# Patient Record
Sex: Female | Born: 1960 | Race: White | Hispanic: No | Marital: Single | Smoking: Former smoker
Health system: Southern US, Community
[De-identification: ages and names within clinical notes are randomized; demographics above are authoritative.]

## PROBLEM LIST (undated history)

## (undated) DIAGNOSIS — E079 Disorder of thyroid, unspecified: Secondary | ICD-10-CM

## (undated) DIAGNOSIS — E785 Hyperlipidemia, unspecified: Secondary | ICD-10-CM

## (undated) DIAGNOSIS — N200 Calculus of kidney: Secondary | ICD-10-CM

## (undated) DIAGNOSIS — Z87442 Personal history of urinary calculi: Secondary | ICD-10-CM

## (undated) DIAGNOSIS — I10 Essential (primary) hypertension: Secondary | ICD-10-CM

## (undated) HISTORY — PX: LITHOTRIPSY: SUR834

## (undated) HISTORY — PX: TUBAL LIGATION: SHX77

## (undated) HISTORY — PX: CHOLECYSTECTOMY: SHX55

## (undated) HISTORY — DX: Essential (primary) hypertension: I10

## (undated) HISTORY — DX: Hyperlipidemia, unspecified: E78.5

---

## 2002-04-14 ENCOUNTER — Encounter: Payer: Self-pay | Admitting: *Deleted

## 2002-04-14 ENCOUNTER — Emergency Department (HOSPITAL_COMMUNITY): Admission: EM | Admit: 2002-04-14 | Discharge: 2002-04-14 | Payer: Self-pay | Admitting: Emergency Medicine

## 2003-08-23 ENCOUNTER — Emergency Department (HOSPITAL_COMMUNITY): Admission: EM | Admit: 2003-08-23 | Discharge: 2003-08-23 | Payer: Self-pay | Admitting: Emergency Medicine

## 2003-09-06 ENCOUNTER — Ambulatory Visit (HOSPITAL_COMMUNITY): Admission: RE | Admit: 2003-09-06 | Discharge: 2003-09-06 | Payer: Self-pay | Admitting: Internal Medicine

## 2006-09-06 ENCOUNTER — Ambulatory Visit (HOSPITAL_COMMUNITY): Admission: RE | Admit: 2006-09-06 | Discharge: 2006-09-06 | Payer: Self-pay | Admitting: Family Medicine

## 2010-02-13 ENCOUNTER — Ambulatory Visit: Payer: Self-pay | Admitting: Cardiology

## 2010-02-13 ENCOUNTER — Inpatient Hospital Stay (HOSPITAL_COMMUNITY): Admission: EM | Admit: 2010-02-13 | Discharge: 2010-02-14 | Payer: Self-pay | Admitting: Emergency Medicine

## 2010-05-01 ENCOUNTER — Encounter (HOSPITAL_COMMUNITY): Admission: RE | Admit: 2010-05-01 | Discharge: 2010-05-02 | Payer: Self-pay | Admitting: Family Medicine

## 2010-10-22 LAB — HEPATIC FUNCTION PANEL
ALT: 19 U/L (ref 0–35)
Albumin: 3.1 g/dL — ABNORMAL LOW (ref 3.5–5.2)
Alkaline Phosphatase: 59 U/L (ref 39–117)
Total Protein: 5.9 g/dL — ABNORMAL LOW (ref 6.0–8.3)

## 2010-10-22 LAB — DIFFERENTIAL
Basophils Relative: 1 % (ref 0–1)
Eosinophils Absolute: 0.4 10*3/uL (ref 0.0–0.7)
Eosinophils Absolute: 0.4 10*3/uL (ref 0.0–0.7)
Eosinophils Relative: 4 % (ref 0–5)
Lymphocytes Relative: 16 % (ref 12–46)
Lymphs Abs: 1.6 10*3/uL (ref 0.7–4.0)
Monocytes Relative: 10 % (ref 3–12)
Neutro Abs: 7.2 10*3/uL (ref 1.7–7.7)
Neutrophils Relative %: 69 % (ref 43–77)
Neutrophils Relative %: 69 % (ref 43–77)

## 2010-10-22 LAB — BASIC METABOLIC PANEL
CO2: 25 mEq/L (ref 19–32)
CO2: 26 mEq/L (ref 19–32)
Calcium: 8.4 mg/dL (ref 8.4–10.5)
Chloride: 108 mEq/L (ref 96–112)
Creatinine, Ser: 0.83 mg/dL (ref 0.4–1.2)
GFR calc Af Amer: >60 mL/min (ref >60)
GFR calc Af Amer: >60 mL/min (ref >60)
GFR calc non Af Amer: >60 mL/min (ref >60)
Sodium: 138 mEq/L (ref 135–145)
Sodium: 139 mEq/L (ref 135–145)

## 2010-10-22 LAB — POCT CARDIAC MARKERS
Myoglobin, poc: 108 ng/mL (ref 12–200)
Troponin i, poc: <0.05 ng/mL (ref 0.00–0.09)
Troponin i, poc: <0.05 ng/mL (ref 0.00–0.09)

## 2010-10-22 LAB — CBC
Hemoglobin: 13.3 g/dL (ref 12.0–15.0)
MCH: 28.2 pg (ref 26.0–34.0)
MCH: 28.2 pg (ref 26.0–34.0)
MCHC: 33.4 g/dL (ref 30.0–36.0)
Platelets: 262 10*3/uL (ref 150–400)
Platelets: 265 10*3/uL (ref 150–400)
RBC: 4.7 MIL/uL (ref 3.87–5.11)
WBC: 10.4 10*3/uL (ref 4.0–10.5)

## 2010-10-22 LAB — MAGNESIUM: Magnesium: 1.8 mg/dL (ref 1.5–2.5)

## 2010-10-22 LAB — LIPID PANEL
Cholesterol: 139 mg/dL (ref 0–200)
HDL: 38 mg/dL — ABNORMAL LOW (ref >39)

## 2010-10-22 LAB — CARDIAC PANEL(CRET KIN+CKTOT+MB+TROPI)
CK, MB: 1.8 ng/mL (ref 0.3–4.0)
CK, MB: 1.9 ng/mL (ref 0.3–4.0)
Relative Index: INVALID (ref 0.0–2.5)
Total CK: 47 U/L (ref 7–177)
Troponin I: 0.02 ng/mL (ref 0.00–0.06)
Troponin I: 0.03 ng/mL (ref 0.00–0.06)

## 2010-10-22 LAB — PROTIME-INR: Prothrombin Time: 13.1 seconds (ref 11.6–15.2)

## 2010-10-22 LAB — D-DIMER, QUANTITATIVE: D-Dimer, Quant: 0.38 ug/mL-FEU (ref 0.00–0.48)

## 2010-12-22 NOTE — Consult Note (Signed)
Mackenzie Lynch, Mackenzie Lynch                             ACCOUNT NO.:  [id number]   MEDICAL RECORD NO.:  [id number]                  PATIENT TYPE:   LOCATION:                                       FACILITY:   PHYSICIAN:  R. Roetta Sessions, M.D.              DATE OF BIRTH:  06/30/1961   DATE OF CONSULTATION:  08/31/2003  DATE OF DISCHARGE:                                   CONSULTATION   GI CONSULTATION   REQUESTING PHYSICIAN:  [name], M.D.   CHIEF COMPLAINT:  Rectal bleeding and weakness.   HISTORY OF PRESENT ILLNESS:  This patient is a 50 year old Caucasian female  who reports approximately 10 days ago she had profound weakness.  This was  also accompanied, 2 days later by a boring epigastric pain. She notes that  the pain is worse on an empty stomach.  She does have occasional heartburn  and indigestion and was started on Nexium 40 mg approximately 1 week ago.  She denies any emesis. She also states that she is passing dark, red clots  of blood in her stools approximately 1-2 weeks prior to her menses.  This  has been going on for the last 6-8 months.  She denies any associated lower  abdominal cramping or pain.  She denies any dysphagia or odynophagia.  She  does have problems with constipation and can go up to 1 week without a bowel  movement.  She denies any history of known hemorrhoids.  She has been  increasing her dietary fiber to help with constipation with minimal relief.  She denies any history of peptic ulcer disease.  She currently uses Motrin  about 3 times a week; and did have history of frequent Goody's Powder use;  however, she stopped approximately 2 months ago altogether.   CURRENT MEDICATIONS:  1. Nexium 40 mg daily.  2. Motrin p.r.n.   ALLERGIES:  SULFA.   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  She does not have any residual deficits.   PAST SURGICAL HISTORY:  1. Cholecystectomy in 1983  secondary to cholelithiasis performed at [facility].  2. Tubal ligation in 1996.  3. Renal lithiasis with extraction in 1998.   FAMILY HISTORY:  No known family history of colorectal carcinoma, liver, or  chronic GI problems.  Mother is age 98 with CAD, father is age 59 with  diabetes mellitus and hypertension.  She has 1 sister with diabetes and  peptic ulcer disease.  She has 2 brothers who are healthy.   SOCIAL HISTORY:  Mackenzie Lynch is single and lives with her 59 year old son who  is in good health.  She is employed full time at a [employer].  She  currently smokes 1/2 pack per day for the last month; although she does  report a  1 pack per day history for approximately 22 years.  She reports  occasional, approximately 4x a year, alcohol use which usually consists of a  mixed drink.  She denies any drug use.   REVIEW OF SYSTEMS:  CONSTITUTIONAL:  Weight is stable.  Appetite is okay.  She denies any fever or chills.  CARDIOVASCULAR:  Denies any chest pain or  palpitations. PULMONARY:  She does report occasional shortness of breath on  exertion.  Denies any dyspnea, hemoptysis, or cough.  GI:  See HPI.  HEMATOLOGY:  Denies any blood dyscrasias or anemias.   PHYSICAL EXAMINATION:  VITAL SIGNS:  Weight 192 pounds.  Height 67 inches.  Temperature 98.3, blood pressure 130/90, pulse 68.  GENERAL:  This patient is a 50 year old Caucasian female who is well  developed and well nourished in no apparent distress.  SKIN:  Skin is pink, warm and dry without any rash or jaundice.  HEENT:  Sclerae are clear.  Nonicteric. Conjunctivae pink. Oropharynx pink  and moist without any lesions.  NECK:  Supple without any masses or thyromegaly.  HEART:  Regular rate and rhythm without murmurs, clicks, rubs, or gallops.  Normal S1-S2.  LUNGS:  Clear to auscultation bilaterally.  ABDOMEN:  Positive bowel sounds x4.  Soft, nontender, nondistended.  She  does have well healed  cholecystectomy scar to midabdomen.  No palpable mass  or hepatosplenomegaly.  No rebound tenderness.  RECTAL:  No external hemorrhoids visualized.  Good sphincter tone.  A small  very scant amount of light brown stool was attained which was Hemoccult  positive.  EXTREMITIES:  2+ pedal pulses bilaterally.  No pedal edema.   LABORATORY STUDIES:  From August 23, 2003:  Urinalysis clear, wet prep was  negative.  Metabolic panel:  Sodium 137, potassium 3.5, chloride 106, CO2  23, glucose 112, BUN 9, creatinine 0.8, calcium 8.8, total protein 7.1,  albumin 3.7, SGOT 23, SGPT 18, alkaline phosphatase at 62, total bilirubin  0.4, direct: 0.1, indirect 0.3, WBC 7.2, RBC 5.18, hemoglobin 15.1,  hematocrit 44.7, platelets 191.   ASSESSMENT:  1. This patient is a 50 year old Caucasian female with persistent epigastric     pain and new onset reflux symptoms along with Hemoccult positive stool.     This definitely warrants further workup.  Apparently she did have an H.     pylori drawn which was negative.  She may be presenting with nonulcer     dyspepsia versus peptic ulcer disease given heme-positive stool today.     This warrants further evaluation with EGD.  2. Rectal bleeding:  It is difficult to ascertain the source of her rectal     bleeding, as she describes it as dark clots. It may be related to benign     anorectal force given history of chronic constipation, diverticular in     nature, or from upper GI source.  Warrants further evaluation with     colonoscopy as well to rule out colorectal carcinoma which is very     unlikely.   RECOMMENDATIONS:  1. She is to continue Nexium 40 mg daily.  2. Will schedule colonoscopy and EGD with Dr. Jena Gauss as soon as possible.  I     have discussed the risks and benefits which include, but are not limited     to bleeding, perforation, infection, and drug reaction with Mackenzie Lynch.     She agrees with this plan.  Consent will be obtained. 3. Will  consider treating constipation pending colonoscopy.  4. Further recommendations pending colonoscopy and EGD.   We would like to thank Dr. Wende Crease for this kind referral.     ________________________________________  ___________________________________________  Nicholas Lose, N.P.                  Jonathon Bellows, M.D.   KC/MEDQ  D:  08/31/2003  T:  08/31/2003  Job:  [id number]   cc:   [name], M.D.   Jonathon Bellows, M.D.  [address]  [place]  [place] [address]  Fax: [phone]

## 2010-12-22 NOTE — Op Note (Signed)
[name], Mackenzie Lynch                           ACCOUNT NO.:  [id number]   MEDICAL RECORD NO.:  [id number]                   PATIENT TYPE:  AMB   LOCATION:  DAY                                  FACILITY:  [facility]   PHYSICIAN:  R. Roetta Sessions, M.D.              DATE OF BIRTH:  27-Jun-1961   DATE OF PROCEDURE:  09/06/2003  DATE OF DISCHARGE:                                 OPERATIVE REPORT   PROCEDURE:  Diagnostic EGD followed by colonoscopy.   INDICATIONS FOR PROCEDURE:  The patient is a 50 year old lady with  longstanding reflux symptoms, recent blood per rectum in setting of  constipation. EGD and colonoscopy are now being performed. This approach has  been discussed with the patient at length. The potential risks, benefits,  and alternatives have been reviewed, questions answered, she is agreeable.  Please see the documentation of medical record for more information.   MONITORING:  O2 saturation, blood pressure, pulse, and respirations were  monitored throughout the entirety of both procedures.   CONSCIOUS SEDATION FOR BOTH PROCEDURES:  Versed 6 mg IV, Demerol 75 mg IV in  divided doses.   INSTRUMENTS:  Olympus GI 440 gastroscope and colonoscope.   EGD FINDINGS:  Examination of the tubular esophagus revealed no mucosal  abnormalities.  The  EG junction was easily traversed.   STOMACH:  The gastric cavity was empty and insufflated well with air. A  thorough examination of the gastric mucosa including a retroflexed view of  the proximal stomach and esophagogastric junction demonstrated no  abnormalities. The pylorus was patent and easily traversed.   DUODENUM:  The bulb and second portion appeared normal.   THERAPEUTIC/DIAGNOSTIC MANEUVERS:  None.   The patient tolerated the procedure well and was prepared for colonoscopy.   Digital rectal exam revealed no abnormalities.   ENDOSCOPIC FINDINGS:  The prep was marginal to adequate.   RECTUM:  Examination of the rectal mucosa  including retroflexed view of the  anal verge revealed only internal hemorrhoids.   COLON:  The colonic mucosa was surveyed from the rectosigmoid junction  through the left transverse right colon to the area of the appendiceal  orifice, ileocecal valve and cecum. These structures were seen and  photographed for the record. From this level the scope was slowly withdrawn.  All previously mentioned mucosal surfaces were again seen. The colonic  mucosa appeared normal. The patient tolerated both procedures well and was  reacted in endoscopy.   IMPRESSION:  1. EGD normal esophagus, stomach, D1 and D2.  2. Colonoscopy findings, internal hemorrhoids otherwise normal rectum.  3. Normal colon.  4. The patient was just started on Nexium 40 mg orally daily a couple of     weeks ago. She was to continue Nexium 40 mg orally daily, antireflux     measures/literature provided.  5. I expect she bled from hemorrhoids as well in a setting of constipation.  MiraLax 17 g orally daily p.r.n. constipation.  6. 10 day course of Anusol-HC suppositories one per rectum at bedtime.     Literature on hemorrhoids provided to [name],  7. Followup appointment with [place] in six weeks.      ___________________________________________                                            Jonathon Bellows, M.D.   RMR/MEDQ  D:  09/06/2003  T:  09/06/2003  Job:  [id number]   cc:   Dr. ____________________

## 2014-05-14 ENCOUNTER — Encounter (HOSPITAL_COMMUNITY): Payer: Self-pay | Admitting: Emergency Medicine

## 2014-05-14 ENCOUNTER — Emergency Department (HOSPITAL_COMMUNITY): Payer: Self-pay

## 2014-05-14 ENCOUNTER — Emergency Department (HOSPITAL_COMMUNITY)
Admission: EM | Admit: 2014-05-14 | Discharge: 2014-05-14 | Disposition: A | Discharge status: Home / Self Care | Payer: Self-pay | Attending: Emergency Medicine | Admitting: Emergency Medicine

## 2014-05-14 DIAGNOSIS — N83202 Unspecified ovarian cyst, left side: Secondary | ICD-10-CM

## 2014-05-14 DIAGNOSIS — Z87442 Personal history of urinary calculi: Secondary | ICD-10-CM | POA: Insufficient documentation

## 2014-05-14 DIAGNOSIS — Z9889 Other specified postprocedural states: Secondary | ICD-10-CM | POA: Insufficient documentation

## 2014-05-14 DIAGNOSIS — N390 Urinary tract infection, site not specified: Secondary | ICD-10-CM | POA: Insufficient documentation

## 2014-05-14 DIAGNOSIS — Z9049 Acquired absence of other specified parts of digestive tract: Secondary | ICD-10-CM | POA: Insufficient documentation

## 2014-05-14 DIAGNOSIS — Z9851 Tubal ligation status: Secondary | ICD-10-CM | POA: Insufficient documentation

## 2014-05-14 DIAGNOSIS — M545 Low back pain, unspecified: Secondary | ICD-10-CM

## 2014-05-14 DIAGNOSIS — Z720 Tobacco use: Secondary | ICD-10-CM | POA: Insufficient documentation

## 2014-05-14 DIAGNOSIS — Z8639 Personal history of other endocrine, nutritional and metabolic disease: Secondary | ICD-10-CM | POA: Insufficient documentation

## 2014-05-14 DIAGNOSIS — N8320 Unspecified ovarian cysts: Secondary | ICD-10-CM | POA: Insufficient documentation

## 2014-05-14 DIAGNOSIS — K5900 Constipation, unspecified: Secondary | ICD-10-CM | POA: Insufficient documentation

## 2014-05-14 DIAGNOSIS — Z791 Long term (current) use of non-steroidal anti-inflammatories (NSAID): Secondary | ICD-10-CM | POA: Insufficient documentation

## 2014-05-14 DIAGNOSIS — R52 Pain, unspecified: Secondary | ICD-10-CM

## 2014-05-14 HISTORY — DX: Calculus of kidney: N20.0

## 2014-05-14 HISTORY — DX: Disorder of thyroid, unspecified: E07.9

## 2014-05-14 LAB — BASIC METABOLIC PANEL
Anion gap: 11 (ref 5–15)
BUN: 10 mg/dL (ref 6–23)
CALCIUM: 8.8 mg/dL (ref 8.4–10.5)
CO2: 26 mEq/L (ref 19–32)
CREATININE: 0.63 mg/dL (ref 0.50–1.10)
Chloride: 102 mEq/L (ref 96–112)
GFR calc Af Amer: >90 mL/min (ref >90)
GLUCOSE: 96 mg/dL (ref 70–99)
Potassium: 4.9 mEq/L (ref 3.7–5.3)
SODIUM: 139 meq/L (ref 137–147)

## 2014-05-14 LAB — CBC WITH DIFFERENTIAL/PLATELET
Basophils Absolute: 0.1 10*3/uL (ref 0.0–0.1)
Basophils Relative: 0 % (ref 0–1)
EOS ABS: 0.3 10*3/uL (ref 0.0–0.7)
EOS PCT: 2 % (ref 0–5)
HCT: 43.1 % (ref 36.0–46.0)
Hemoglobin: 14.4 g/dL (ref 12.0–15.0)
LYMPHS ABS: 1.4 10*3/uL (ref 0.7–4.0)
Lymphocytes Relative: 10 % — ABNORMAL LOW (ref 12–46)
MCH: 29.1 pg (ref 26.0–34.0)
MCHC: 33.4 g/dL (ref 30.0–36.0)
MCV: 87.1 fL (ref 78.0–100.0)
Monocytes Absolute: 1.1 10*3/uL — ABNORMAL HIGH (ref 0.1–1.0)
Monocytes Relative: 8 % (ref 3–12)
Neutro Abs: 11.2 10*3/uL — ABNORMAL HIGH (ref 1.7–7.7)
Neutrophils Relative %: 80 % — ABNORMAL HIGH (ref 43–77)
PLATELETS: 206 10*3/uL (ref 150–400)
RBC: 4.95 MIL/uL (ref 3.87–5.11)
RDW: 13.9 % (ref 11.5–15.5)
WBC: 14.1 10*3/uL — ABNORMAL HIGH (ref 4.0–10.5)

## 2014-05-14 LAB — URINE MICROSCOPIC-ADD ON

## 2014-05-14 LAB — URINALYSIS, ROUTINE W REFLEX MICROSCOPIC
Bilirubin Urine: NEGATIVE
GLUCOSE, UA: NEGATIVE mg/dL
Ketones, ur: NEGATIVE mg/dL
NITRITE: NEGATIVE
Specific Gravity, Urine: 1.02 (ref 1.005–1.030)
UROBILINOGEN UA: 0.2 mg/dL (ref 0.0–1.0)
pH: 6 (ref 5.0–8.0)

## 2014-05-14 LAB — CK: Total CK: 24 U/L (ref 7–177)

## 2014-05-14 MED ORDER — OXYCODONE-ACETAMINOPHEN 5-325 MG PO TABS
2.0000 | ORAL_TABLET | Freq: Once | ORAL | Status: AC
  Administered 2014-05-14: 2 via ORAL
  Filled 2014-05-14: qty 2

## 2014-05-14 MED ORDER — HYDROCODONE-ACETAMINOPHEN 5-325 MG PO TABS: ORAL_TABLET | ORAL | Status: DC

## 2014-05-14 MED ORDER — METHOCARBAMOL 500 MG PO TABS: 1000.0000 mg | ORAL_TABLET | Freq: Four times a day (QID) | ORAL | Status: DC | PRN

## 2014-05-14 MED ORDER — CEPHALEXIN 500 MG PO CAPS: 500.0000 mg | ORAL_CAPSULE | Freq: Four times a day (QID) | ORAL | Status: DC

## 2014-05-14 MED ORDER — NAPROXEN 250 MG PO TABS: 250.0000 mg | ORAL_TABLET | Freq: Two times a day (BID) | ORAL | Status: DC | PRN

## 2014-05-14 NOTE — Discharge Instructions (Signed)
°Emergency Department Resource Guide °1) Find a Doctor and Pay Out of Pocket °Although you won't have to find out who is covered by your insurance plan, it is a good idea to ask around and get recommendations. You will then need to call the office and see if the doctor you have chosen will accept you as a new patient and what types of options they offer for patients who are self-pay. Some doctors offer discounts or will set up payment plans for their patients who do not have insurance, but you will need to ask so you aren't surprised when you get to your appointment. ° °2) Contact Your Local Health Department °Not all health departments have doctors that can see patients for sick visits, but many do, so it is worth a call to see if yours does. If you don't know where your local health department is, you can check in your phone book. The CDC also has a tool to help you locate your state's health department, and many state websites also have listings of all of their local health departments. ° °3) Find a Walk-in Clinic °If your illness is not likely to be very severe or complicated, you may want to try a walk in clinic. These are popping up all over the country in pharmacies, drugstores, and shopping centers. They're usually staffed by nurse practitioners or physician assistants that have been trained to treat common illnesses and complaints. They're usually fairly quick and inexpensive. However, if you have serious medical issues or chronic medical problems, these are probably not your best option. ° °No Primary Care Doctor: °- Call Health Connect at  832-8000 - they can help you locate a primary care doctor that  accepts your insurance, provides certain services, etc. °- Physician Referral Service- 1-800-533-3463 ° °Chronic Pain Problems: °Organization         Address  Phone   Notes  °[facility]  (336) 297-2271 Patients need to be referred by their primary care doctor.  ° °Medication  Assistance: °Organization         Address  Phone   Notes  °[place] [facility] [address] °[place], [place] [address] (336) 641-8030 --Must be a resident of [place] °-- Must have NO insurance coverage whatsoever (no Medicaid/ Medicare, etc.) °-- The pt. MUST have a primary care doctor that directs their care regularly and follows them in the community °  °[facility]  (866) 331-1348   °[employer]  (888) 892-1162   ° °Agencies that provide inexpensive medical care: °Organization         Address  Phone   Notes  °[facility]  (336) 832-8035   °[facility]    (336) 832-7272   °[facility] [address] °[place], [place] [address] (336) 832-4777   °[facility] [address], °[place] (336) 271-4999   °[facility]    (336) 373-0678   °[facility]    (336) 272-1050   °[facility] ° [address], [place] Phone:  (336) 832-4444, Fax:  (336) 832-4440 Hours of Operation:  9 am - 6 pm, M-F.  Also accepts Medicaid/Medicare and self-pay.  °[facility] ° [address], [place] Phone: (336) 832-3150, Fax: (336) [phone]. Hours of Operation:  8:30 am - 5:30 pm, M-F.  Also accepts Medicaid and self-pay.  °[facility] [place] [address], [place] Phone: (336) 878-6027   °[facility] [address], [place], [place] (336)723-1848, Ext. 123 Mondays & Thursdays: 7-9 AM.  First 15 patients are seen on a first come, first serve basis. °  ° °Medicaid-accepting [place] Providers: ° °Organization         Address  Phone   Notes  °[facility] [address], [place] (336) 641-2100 Also accepts self-pay patients.  °[facility] [address], [place] ° (336) 856-9996   °[facility] [address], [place]  (336) 288-8857   °[facility] [address] [place] [address], [place] (336) 299-7000   °Ve[facility] B[facility] [address], [place]  ° (336) 373-1557 Only accepts [place] Access Medicaid patients after they have their name applied to their card.  ° °Self-Pay (no insurance) in [place]: ° °Organization         Address  Phone   Notes  °Sickle Cell Patients, [facility] [address], [place] (336) 832-1970   °[facility] [address], [place] (336) 832-4400   °[facility] ° [address], [place] (336) 992-4800   °[facility]/Dr. Osei-Bonsu ° [address] [place] [address], [place] or [address], [place] (336) 841-8500 Phone number for both [place] and [place] locations is the same.  °[facility] [address], [place] (336) 299-0000   °[facility] [address] [place] [address], [place] or [address] (336) 852-7530 °(336) 878-2260   °[facility] [address], [place] (336) 350-1642, phone; (336) 294-5005, fax Sees patients 1st and 3rd Saturday of every month.  Must not qualify for public or private insurance (i.e. Medicaid, Medicare, [place] Health Choice, Veterans' Benefits) • Household income should be no more than 200% of the poverty level •The clinic cannot treat you if you are pregnant or think you are pregnant • Sexually transmitted diseases are not treated at the clinic.  ° ° °Dental Care: °Organization         Address  Phone  Notes  °[place] [facility] [address], [place] (336) 641-6152 Accepts children up to age 21 who are enrolled in Medicaid or [place] Health Choice; pregnant women with a Medicaid card; and children who have applied for Medicaid or [place] Health Choice, but were declined, whose parents can pay a reduced fee at time of service.  °[place]  [facility] [place]  [address], [place] (336) 641-7733 Accepts children up to age 21 who are enrolled in Medicaid or [place] Health Choice; pregnant women with a Medicaid card; and children who have applied for Medicaid or [place] Health Choice, but were declined, whose parents can pay a reduced fee at time of service.  °[facility] ° [address], [place] (336) 641-4533 Patients are seen by appointment only. Walk-ins are not accepted. [facility] will see patients 18 years of age and older. °Monday - Tuesday (8am-5pm) °Most Wednesdays (8:30-5pm) °$30 per visit, cash only  °[facility] ° [address], [place] (336) 641-4533 Patients are seen by appointment only. Walk-ins are not accepted. [facility] will see patients 18 years of age and older. °One   Wednesday Evening (Monthly: Volunteer Based).  $30 per visit, cash only  °[facility]  (919) 537-3737 for adults; Children under age 4, call Graduate Pediatric Dentistry at ([phone]) [phone]. Children aged 4-14, please call ([phone]) [phone] to request a pediatric application. ° Dental services are provided in all areas of dental care including fillings, crowns and bridges, complete and partial dentures, implants, gum treatment, root canals, and extractions. Preventive care is also provided. Treatment is provided to both adults and children. °Patients are selected via a lottery and there is often a waiting list. °  °[facility] [address], °[place] ° (336) 763-8833 [email or web address] °  °[facility] [address], [place], [place] (336)723-1848, Ext. 123 Second and Fourth Thursday of each month, opens at 6:30 AM; Clinic ends at 9 AM.  Patients are seen on a first-come first-served basis, and a limited number are seen during each clinic.  ° °[facility] ° [address], [place], [place] (336) 723-7904    Eligibility Requirements °You must have lived in [place], [place], or [place] counties for at least the last three months. °  You cannot be eligible for state or federal sponsored healthcare insurance, including Veterans Administration, Medicaid, or Medicare. °  You generally cannot be eligible for healthcare insurance through your employer.  °  How to apply: °Eligibility screenings are held every Tuesday and Wednesday afternoon from 1:00 pm until 4:00 pm. You do not need an appointment for the interview!  °[facility] [address], [place], [place] 336-631-2330   °[facility]  336-342-8273   °[facility]  336-703-3100   °[facility]  336-570-6415   ° °Behavioral Health Resources in the Community: °Intensive Outpatient Programs °Organization         Address  Phone  Notes  °[facility] [address], [place], [place] 336-878-6098   °[facility] [address], [place], [place] 336-832-9800   °[facility] [address], [place], [place] ° 336-882-2125   °[facility] [address],  °[place], [place] 1-800-853-5163 or 336-641-4981   °Substance Abuse Resources °Organization         Address  Phone  Notes  °[facility]  336-882-2125   °[facility]  336-784-9470   °[facility]  336-285-9073   °[facility]  336-845-3988   °Residential & Outpatient Substance Abuse Program  1-800-659-3381   °Psychological Services °Organization         Address  Phone  Notes  °[facility]  336- 832-9600   °[facility]  336- 378-7881   °[facility] [address], [place] 1-800-853-5163 or 336-641-4981   ° °Mobile Crisis Teams °Organization         Address  Phone  Notes  °[facility], Mobile Crisis Care Unit  1-877-626-1772   °Assertive °[facility] ° [address]  [place], [place] 336-834-9664   °Sharon DeEsch [address] °[place] 336-554-5454   ° °Self-Help/Support Groups °Organization         Address  Phone             Notes  °[facility] - variety of support groups  336- 373-1402 Call for more information  °Narcotics Anonymous (NA), [facility] [address], °[place]  2 meetings at this location  ° °  Residential Treatment Programs Organization         Address  Phone  Notes  [facility] [address]    [place]  1-618 630 0682   [facility]  [address] [place] [address], [place], [place]   [place], [place] 620-351-7446 Admissions: 8am-3pm M-F  [facility] [address]    [place], [place] 916-945-0388   [facility] [address] [place], [place], [place]   [facility] [address],  [place], [place]   [facility] - Intensive Outpatient [address], [place], [place]   [facility] ([place]  [place], [place] 1-785-753-2873 or 618 682 2160   [facility] [address] [place], [place] Accepts Medicaid  [facility] [place] [address]  [place] 1-909-855-7828 Substance Abuse/Addiction Treatment   [facility] Organization         Address  Phone  Notes  [facility]  [phone]   Domenic Schwab, PhD [address] [place], [place]   669-232-5089 or 316-793-0683   [facility] [place], [place] 619-154-0869   [facility] [address], [place], [place] [phone] Insurance/Medicaid/sponsorship through [facility] [address] [place] [address] [place]                                    [place], [place] [phone] [facility] [address], [place] [phone]    Dr. Adele Schilder  [phone]   [facility] Dept. 1) [address] [place] 2) [place] 3)  [address], [place] ([phone]) [phone]  [phone]   [place] (512)711-1498 or [phone] (After Hours)       Take the prescriptions as directed.  Apply moist heat or ice to the area(s) of discomfort, for 15 minutes at a time, several times per day for the next few days.  Do not fall asleep on a heating or ice pack.  Take over the counter laxative (such as miralax, milk of magnesia, senokot) AND a dulcolax suppository today and repeat both tomorrow.  Begin to take over the counter stool softener (colace), as directed on packaging, for the next month. Call your regular medical doctor and your OB/GYN doctor on Monday to schedule a follow up appointment next week.  Return to the Emergency Department immediately if worsening.

## 2014-05-14 NOTE — ED Provider Notes (Signed)
CSN: [id number]     Arrival date & time 05/14/14  1235 History   First MD Initiated Contact with Patient 05/14/14 1311     Chief Complaint  Patient presents with  . Back Pain      HPI Pt was seen at 1315. Per pt, c/o gradual onset and persistence of constant bilat LE's "aching pain" for the past 1 week, and bilateral low back "pain" for the past 3 days. Describes the pain as radiating into her bilat abd areas. Pt states she has had these symptoms previously and was dx with a UTI. Denies dysuria/hematuria, no vaginal bleeding/discharge, no fevers, no rash, no CP/SOB, no abd pain, no focal motor weakness, no tingling/numbness in extremities, no ataxia, no saddle anesthesia, no incont/retention of bowel or bladder, no specific calf pain or unilateral swelling.     Past Medical History  Diagnosis Date  . Thyroid disease   . Kidney stones    Past Surgical History  Procedure Laterality Date  . Cholecystectomy    . Lithotripsy    . Tubal ligation      History  Substance Use Topics  . Smoking status: Current Every Day Smoker -- 1.00 packs/day    Types: Cigarettes  . Smokeless tobacco: Not on file  . Alcohol Use: No    Review of Systems ROS: Statement: All systems negative except as marked or noted in the HPI; Constitutional: Negative for fever and chills. ; ; Eyes: Negative for eye pain, redness and discharge. ; ; ENMT: Negative for ear pain, hoarseness, nasal congestion, sinus pressure and sore throat. ; ; Cardiovascular: Negative for chest pain, palpitations, diaphoresis, dyspnea and peripheral edema. ; ; Respiratory: Negative for cough, wheezing and stridor. ; ; Gastrointestinal: Negative for nausea, vomiting, diarrhea, abdominal pain, blood in stool, hematemesis, jaundice and rectal bleeding. . ; ; Genitourinary: Negative for dysuria, flank pain and hematuria. ; ; Musculoskeletal: +LBP, legs pain. Negative for neck pain. Negative for swelling and trauma.; ; Skin: Negative for pruritus,  rash, abrasions, blisters, bruising and skin lesion.; ; Neuro: Negative for headache, lightheadedness and neck stiffness. Negative for weakness, altered level of consciousness , altered mental status, extremity weakness, paresthesias, involuntary movement, seizure and syncope.      Allergies  Review of patient's allergies indicates no known allergies.  Home Medications   Prior to Admission medications   Medication Sig Start Date End Date Taking? Authorizing Provider  ibuprofen (ADVIL,MOTRIN) 200 MG tablet Take 800 mg by mouth every 6 (six) hours as needed for moderate pain.   Yes Historical Provider, MD  naproxen sodium (ANAPROX) 220 MG tablet Take 220 mg by mouth daily as needed (pain).   Yes Historical Provider, MD   BP 123/75  Pulse 95  Temp(Src) 98.9 F (37.2 C) (Oral)  Resp 20  Ht 5\' 8"  (1.727 m)  Wt 170 lb (77.111 kg)  BMI 25.85 kg/m2  SpO2 99%  LMP 04/25/2014 Physical Exam 1320: Physical examination:  Nursing notes reviewed; Vital signs and O2 SAT reviewed;  Constitutional: Well developed, Well nourished, Well hydrated, In no acute distress; Head:  Normocephalic, atraumatic; Eyes: EOMI, PERRL, No scleral icterus; ENMT: Mouth and pharynx normal, Mucous membranes moist; Neck: Supple, Full range of motion, No lymphadenopathy; Cardiovascular: Regular rate and rhythm, No murmur, rub, or gallop; Respiratory: Breath sounds clear & equal bilaterally, No rales, rhonchi, wheezes.  Speaking full sentences with ease, Normal respiratory effort/excursion; Chest: Nontender, Movement normal; Abdomen: Soft, Nontender, Nondistended, Normal bowel sounds; Genitourinary: No CVA tenderness; Spine:  No midline CS, TS, LS tenderness. +mild TTP bilat lumbar paraspinal muscles.;; Extremities: Pulses normal, No tenderness, No deformity, no rash. Bilat LE's muscles compartments soft. No edema, No calf edema or asymmetry.; Neuro: AA&Ox3, Major CN grossly intact.  Speech clear. No gross focal motor or sensory  deficits in extremities. Strength 5/5 equal bilat UE's and LE's, including great toe dorsiflexion.  DTR 2/4 equal bilat UE's and LE's.  No gross sensory deficits.  Neg straight leg raises bilat. Climbs on and off stretcher easily by herself. Gait steady.; Skin: Color normal, Warm, Dry.   ED Course  Procedures     EKG Interpretation None      MDM  MDM Reviewed: previous chart, nursing note and vitals Reviewed previous: labs Interpretation: labs and CT scan   Results for orders placed during the hospital encounter of 05/14/14  CK      Result Value Ref Range   Total CK 24  7 - 177 U/L  CBC WITH DIFFERENTIAL      Result Value Ref Range   WBC 14.1 (*) 4.0 - 10.5 K/uL   RBC 4.95  3.87 - 5.11 MIL/uL   Hemoglobin 14.4  12.0 - 15.0 g/dL   HCT 43.1  36.0 - 46.0 %   MCV 87.1  78.0 - 100.0 fL   MCH 29.1  26.0 - 34.0 pg   MCHC 33.4  30.0 - 36.0 g/dL   RDW 13.9  11.5 - 15.5 %   Platelets 206  150 - 400 K/uL   Neutrophils Relative % 80 (*) 43 - 77 %   Neutro Abs 11.2 (*) 1.7 - 7.7 K/uL   Lymphocytes Relative 10 (*) 12 - 46 %   Lymphs Abs 1.4  0.7 - 4.0 K/uL   Monocytes Relative 8  3 - 12 %   Monocytes Absolute 1.1 (*) 0.1 - 1.0 K/uL   Eosinophils Relative 2  0 - 5 %   Eosinophils Absolute 0.3  0.0 - 0.7 K/uL   Basophils Relative 0  0 - 1 %   Basophils Absolute 0.1  0.0 - 0.1 K/uL  BASIC METABOLIC PANEL      Result Value Ref Range   Sodium 139  137 - 147 mEq/L   Potassium 4.9  3.7 - 5.3 mEq/L   Chloride 102  96 - 112 mEq/L   CO2 26  19 - 32 mEq/L   Glucose, Bld 96  70 - 99 mg/dL   BUN 10  6 - 23 mg/dL   Creatinine, Ser 0.63  0.50 - 1.10 mg/dL   Calcium 8.8  8.4 - 10.5 mg/dL   GFR calc non Af Amer >90  >90 mL/min   GFR calc Af Amer >90  >90 mL/min   Anion gap 11  5 - 15  URINALYSIS, ROUTINE W REFLEX MICROSCOPIC      Result Value Ref Range   Color, Urine YELLOW  YELLOW   APPearance CLOUDY (*) CLEAR   Specific Gravity, Urine 1.020  1.005 - 1.030   pH 6.0  5.0 - 8.0    Glucose, UA NEGATIVE  NEGATIVE mg/dL   Hgb urine dipstick MODERATE (*) NEGATIVE   Bilirubin Urine NEGATIVE  NEGATIVE   Ketones, ur NEGATIVE  NEGATIVE mg/dL   Protein, ur TRACE (*) NEGATIVE mg/dL   Urobilinogen, UA 0.2  0.0 - 1.0 mg/dL   Nitrite NEGATIVE  NEGATIVE   Leukocytes, UA SMALL (*) NEGATIVE  URINE MICROSCOPIC-ADD ON      Result Value Ref Range  Squamous Epithelial / LPF RARE  RARE   WBC, UA 21-50  <3 WBC/hpf   RBC / HPF 7-10  <3 RBC/hpf   Bacteria, UA FEW (*) RARE   Ct Renal Stone Study 05/14/2014   CLINICAL DATA:  Leg pain for 1 week, back pain in right lower abdominal pain for 3 days  EXAM: CT ABDOMEN AND PELVIS WITHOUT CONTRAST  TECHNIQUE: Multidetector CT imaging of the abdomen and pelvis was performed following the standard protocol without IV contrast.  COMPARISON:  None.  FINDINGS: Sagittal images of the spine shows no destructive bony lesions. Atherosclerotic calcifications of distal abdominal aorta. Lung bases are unremarkable.  Unenhanced liver shows no biliary ductal dilatation. The patient is status postcholecystectomy.  Unenhanced pancreas spleen and adrenal glands are unremarkable. Unenhanced kidneys are symmetrical in size. No hydronephrosis or hydroureter. No nephrolithiasis.  No aortic aneurysm. No calcified ureteral calculi. Moderate stool noted in right colon and proximal transverse colon. Abundant stool noted within cecum. No pericecal inflammation. Normal appendix clearly visualized.  No small bowel obstruction. No ascites or free air. No adenopathy. There is a left ovarian cyst measures 3 by 2.8 cm. The urinary bladder is unremarkable. Calcified pelvic phleboliths are noted. No inguinal adenopathy. No destructive bony lesions are noted within pelvis. Mild degenerative changes bilateral SI joints. Hip joints are unremarkable on coronal images.  IMPRESSION: 1. No nephrolithiasis.  No calcified ureteral calculi. 2. No hydronephrosis or hydroureter. 3. Status  postcholecystectomy. 4. Moderate stool noted in right colon and proximal transverse colon. Abundant stool noted within cecum. No pericecal inflammation. Normal appendix. 5. There is a left ovarian cyst measures 3 x 2.8 cm.   Electronically Signed   By: Lahoma Crocker M.D.   On: 05/14/2014 15:03    1545:  +UTI, UC pending; will tx with keflex. Bilat LE's muscles compartments soft with NMS intact; doubt compartment syndrome, doubt cauda equina. Will tx symptomatically for pain at this time. Dx and testing d/w pt and family.  Questions answered.  Verb understanding, agreeable to d/c home with outpt f/u.    Francine Graven, DO 05/16/14 2223

## 2014-05-14 NOTE — ED Notes (Signed)
Pt complains of leg pain for 1 week and back pain with radiation to lower abd for 3 days. Pt denies pain with urination.

## 2014-05-17 LAB — URINE CULTURE: Colony Count: 60000

## 2014-05-18 ENCOUNTER — Telehealth (HOSPITAL_BASED_OUTPATIENT_CLINIC_OR_DEPARTMENT_OTHER): Payer: Self-pay | Admitting: Emergency Medicine

## 2014-05-18 NOTE — Telephone Encounter (Signed)
Post ED Visit - Positive Culture Follow-up  Culture report reviewed by antimicrobial stewardship pharmacist: []  Wes Crocker, Pharm.D., BCPS [x]  Heide Guile, Pharm.D., BCPS []  Alycia Rossetti, Pharm.D., BCPS []  Rodri­guez Hevia, Pharm.D., BCPS, AAHIVP []  Legrand Como, Pharm.D., BCPS, AAHIVP []  Carly Sabat, Pharm.D. []  Elenor Quinones, Pharm.D.  Positive urine culture Treated with cephalexin, organism sensitive to the same and no further patient follow-up is required at this time.  Hazle Nordmann 05/18/2014, 9:43 AM

## 2019-08-26 ENCOUNTER — Ambulatory Visit
Admission: EM | Admit: 2019-08-26 | Discharge: 2019-08-26 | Disposition: A | Discharge status: Home / Self Care | Payer: Self-pay

## 2019-08-26 ENCOUNTER — Other Ambulatory Visit: Payer: Self-pay

## 2019-08-26 DIAGNOSIS — Z20822 Contact with and (suspected) exposure to covid-19: Secondary | ICD-10-CM

## 2019-08-26 MED ORDER — FLUTICASONE PROPIONATE 50 MCG/ACT NA SUSP: 1.0000 | Freq: Every day | NASAL | 0 refills | Status: DC

## 2019-08-26 MED ORDER — PREDNISONE 10 MG PO TABS: 20.0000 mg | ORAL_TABLET | Freq: Every day | ORAL | 0 refills | Status: DC

## 2019-08-26 MED ORDER — BENZONATATE 100 MG PO CAPS: 100.0000 mg | ORAL_CAPSULE | Freq: Three times a day (TID) | ORAL | 0 refills | Status: DC

## 2019-08-26 NOTE — ED Triage Notes (Signed)
Pt presents with nasal congestion that developed Friday. Pt was prescribed azithromycin by PCP and has taken 2 days and is feeling worse and has developed cough

## 2019-08-26 NOTE — ED Provider Notes (Signed)
[facility]    CSN: [id number] Arrival date & time: 08/26/19  [id number]      History   Chief Complaint Chief Complaint  Patient presents with  . Nasal Congestion    HPI Mackenzie Lynch is a 59 y.o. female.   Pain Hartman 59 years old female presented to the urgent care for complaint of nasal congestion for the past 7 days and any symptom of cough for the past 1 day.  Reported use of azithromycin with no relief the past 2 days.  Denies sick exposure to COVID, flu or strep.  Denies recent travel.  Denies aggravating or alleviating symptoms.  Denies previous COVID infection.   Denies fever, chills, fatigue,  rhinorrhea, sore throat,  SOB, wheezing, chest pain, nausea, vomiting, changes in bowel or bladder habits.    The history is provided by the patient. No language interpreter was used.    Past Medical History:  Diagnosis Date  . Kidney stones   . Thyroid disease     There are no problems to display for this patient.   Past Surgical History:  Procedure Laterality Date  . CHOLECYSTECTOMY    . LITHOTRIPSY    . TUBAL LIGATION      OB History   No obstetric history on file.      Home Medications    Prior to Admission medications   Medication Sig Start Date End Date Taking? Authorizing Provider  azithromycin (ZITHROMAX) 1 g powder Take 1 g by mouth once.   Yes [provider]  benzonatate (TESSALON) 100 MG capsule Take 1 capsule (100 mg total) by mouth every 8 (eight) hours. 08/26/19   Navpreet Szczygiel, Darrelyn Hillock, FNP  cephALEXin (KEFLEX) 500 MG capsule Take 1 capsule (500 mg total) by mouth 4 (four) times daily. 05/14/14   Francine Graven, DO  fluticasone (FLONASE) 50 MCG/ACT nasal spray Place 1 spray into both nostrils daily for 14 days. 08/26/19 09/09/19  Emerson Monte, FNP  HYDROcodone-acetaminophen (NORCO/VICODIN) 5-325 MG per tablet 1 or 2 tabs PO q6 hours prn pain 05/14/14   Francine Graven, DO  ibuprofen (ADVIL,MOTRIN) 200 MG tablet Take 800 mg by mouth  every 6 (six) hours as needed for moderate pain.    [provider]  lisinopril (ZESTRIL) 20 MG tablet Take 20 mg by mouth daily. 06/16/19   [provider]  methocarbamol (ROBAXIN) 500 MG tablet Take 2 tablets (1,000 mg total) by mouth 4 (four) times daily as needed for muscle spasms (muscle spasm/pain). 05/14/14   Francine Graven, DO  naproxen (NAPROSYN) 250 MG tablet Take 1 tablet (250 mg total) by mouth 2 (two) times daily as needed for mild pain or moderate pain (take with food). 05/14/14   Francine Graven, DO  naproxen sodium (ANAPROX) 220 MG tablet Take 220 mg by mouth daily as needed (pain).    [provider]  predniSONE (DELTASONE) 10 MG tablet Take 2 tablets (20 mg total) by mouth daily. 08/26/19   AvegnoDarrelyn Hillock, FNP    Family History Family History  Problem Relation Age of Onset  . Heart failure Mother   . Diabetes Father   . Hypertension Father     Social History Social History   Tobacco Use  . Smoking status: Current Every Day Smoker    Packs/day: 1.00    Types: Cigarettes  Substance Use Topics  . Alcohol use: No  . Drug use: No     Allergies   Patient has no known allergies.  Review of Systems Review of Systems  Constitutional: Negative.   HENT: Positive for congestion.   Respiratory: Positive for cough.   Cardiovascular: Negative.   Gastrointestinal: Negative.   Neurological: Negative.   All other systems reviewed and are negative.    Physical Exam Triage Vital Signs ED Triage Vitals  Enc Vitals Group     BP      Pulse      Resp      Temp      Temp src      SpO2      Weight      Height      Head Circumference      Peak Flow      Pain Score      Pain Loc      Pain Edu?      Excl. in [place]?    No data found.  Updated Vital Signs BP 110/78   Pulse 82   Temp 98.3 F (36.8 C)   Resp 20   LMP 04/25/2014   SpO2 96%   Visual Acuity Right Eye Distance:   Left Eye Distance:   Bilateral Distance:     Right Eye Near:   Left Eye Near:    Bilateral Near:     Physical Exam Vitals and nursing note reviewed.  Constitutional:      General: She is not in acute distress.    Appearance: Normal appearance. She is normal weight. She is not ill-appearing or toxic-appearing.  HENT:     Head: Normocephalic.     Right Ear: Tympanic membrane, ear canal and external ear normal. There is no impacted cerumen.     Left Ear: Tympanic membrane, ear canal and external ear normal. There is no impacted cerumen.     Nose: Nose normal. No congestion.     Mouth/Throat:     Mouth: Mucous membranes are moist.     Pharynx: Oropharynx is clear. No oropharyngeal exudate or posterior oropharyngeal erythema.  Cardiovascular:     Rate and Rhythm: Normal rate and regular rhythm.     Pulses: Normal pulses.     Heart sounds: Normal heart sounds. No murmur.  Pulmonary:     Effort: Pulmonary effort is normal. No respiratory distress.     Breath sounds: Normal breath sounds. No wheezing or rhonchi.  Chest:     Chest wall: No tenderness.  Abdominal:     General: Abdomen is flat. Bowel sounds are normal. There is no distension.     Palpations: There is no mass.     Tenderness: There is no abdominal tenderness.  Skin:    Capillary Refill: Capillary refill takes less than 2 seconds.  Neurological:     General: No focal deficit present.     Mental Status: She is alert and oriented to person, place, and time.      UC Treatments / Results  Labs (all labs ordered are listed, but only abnormal results are displayed) Labs Reviewed  NOVEL CORONAVIRUS, NAA    EKG   Radiology No results found.  Procedures Procedures (including critical care time)  Medications Ordered in UC Medications - No data to display  Initial Impression / Assessment and Plan / UC Course  I have reviewed the triage vital signs and the nursing notes.  Pertinent labs & imaging results that were available during my care of the patient  were reviewed by me and considered in my medical decision making (see chart for details).   COVID-19  test was ordered Advised patient to quarantine Flonase was prescribed  for congestion Tessalon Perles for cough Prednisone for inflammation To go to ED for worsening symptoms Patient verbalized understanding of the plan of care   Final Clinical Impressions(s) / UC Diagnoses   Final diagnoses:  Suspected COVID-19 virus infection     Discharge Instructions     COVID testing ordered.  It will take between 2-7 days for test results.  Someone will contact you regarding abnormal results.    In the meantime: You should remain isolated in your home for 10 days from symptom onset AND greater than 72 hours after symptoms resolution (absence of fever without the use of fever-reducing medication and improvement in respiratory symptoms), whichever is longer Get plenty of rest and push fluids Tessalon Perles prescribed for cough Prednisone for inflammation Flonase prescribed for nasal congestion and runny nose Use medications daily for symptom relief Use OTC medications like ibuprofen or tylenol as needed fever or pain Call or go to the ED if you have any new or worsening symptoms such as fever, worsening cough, shortness of breath, chest tightness, chest pain, turning blue, changes in mental status, etc...     ED Prescriptions    Medication Sig Dispense Auth. Provider   fluticasone (FLONASE) 50 MCG/ACT nasal spray Place 1 spray into both nostrils daily for 14 days. 16 g Xana Bradt S, FNP   predniSONE (DELTASONE) 10 MG tablet Take 2 tablets (20 mg total) by mouth daily. 15 tablet Hannahgrace Lalli, Darrelyn Hillock, FNP   benzonatate (TESSALON) 100 MG capsule Take 1 capsule (100 mg total) by mouth every 8 (eight) hours. 21 capsule Lenah Messenger, Darrelyn Hillock, FNP     PDMP not reviewed this encounter.   Emerson Monte, [place] 08/26/19 [phone]

## 2019-08-26 NOTE — Discharge Instructions (Addendum)
COVID testing ordered.  It will take between 2-7 days for test results.  Someone will contact you regarding abnormal results.    In the meantime: You should remain isolated in your home for 10 days from symptom onset AND greater than 72 hours after symptoms resolution (absence of fever without the use of fever-reducing medication and improvement in respiratory symptoms), whichever is longer Get plenty of rest and push fluids Tessalon Perles prescribed for cough Prednisone for inflammation Flonase prescribed for nasal congestion and runny nose Use medications daily for symptom relief Use OTC medications like ibuprofen or tylenol as needed fever or pain Call or go to the ED if you have any new or worsening symptoms such as fever, worsening cough, shortness of breath, chest tightness, chest pain, turning blue, changes in mental status, etc..Marland Kitchen

## 2019-08-27 LAB — NOVEL CORONAVIRUS, NAA: SARS-CoV-2, NAA: NOT DETECTED

## 2019-10-25 ENCOUNTER — Other Ambulatory Visit: Payer: Self-pay

## 2019-10-25 ENCOUNTER — Ambulatory Visit
Admission: EM | Admit: 2019-10-25 | Discharge: 2019-10-25 | Disposition: A | Discharge status: Home / Self Care | Payer: Self-pay | Attending: Emergency Medicine | Admitting: Emergency Medicine

## 2019-10-25 DIAGNOSIS — R21 Rash and other nonspecific skin eruption: Secondary | ICD-10-CM

## 2019-10-25 DIAGNOSIS — R220 Localized swelling, mass and lump, head: Secondary | ICD-10-CM

## 2019-10-25 DIAGNOSIS — L03211 Cellulitis of face: Secondary | ICD-10-CM

## 2019-10-25 MED ORDER — PREDNISONE 10 MG (21) PO TBPK: ORAL_TABLET | Freq: Every day | ORAL | 0 refills | Status: DC

## 2019-10-25 MED ORDER — AMOXICILLIN-POT CLAVULANATE 875-125 MG PO TABS: 1.0000 | ORAL_TABLET | Freq: Two times a day (BID) | ORAL | 0 refills | Status: AC

## 2019-10-25 MED ORDER — DEXAMETHASONE SODIUM PHOSPHATE 10 MG/ML IJ SOLN
10.0000 mg | Freq: Once | INTRAMUSCULAR | Status: AC
  Administered 2019-10-25: 10 mg via INTRAMUSCULAR

## 2019-10-25 NOTE — ED Provider Notes (Addendum)
[place]   [id number] 10/25/19 Arrival Time: 0814  CC: Rash  SUBJECTIVE:  Mackenzie Lynch is a 59 y.o. female who presents with facial redness and swelling localized to cheeks and top of nose that began 1 day ago.  Denies changes in soaps, detergents, close contacts with similar rash, known trigger or allergy.  Denies medications change or starting a new medication recently.  Speculates she may have gotten something on her mask that caused the rash.  Describes it as red, mildly painful and itchy.  Has tried OTC benadryl and neosporin creams without relief.  Symptoms are made worse to the touch.  Denies similar symptoms in the past.   Complains of HA, clear drainage with rash.  Also mentions needed to have a tooth pulled in the near future, but denies tooth pain.  Denies fever, chills, nausea, vomiting, oral swelling, trouble breathing, trouble swallowing, SOB, chest pain, abdominal pain, changes in bowel or bladder function.    ROS: As per HPI.  All other pertinent ROS negative.     Past Medical History:  Diagnosis Date  . Kidney stones   . Thyroid disease    Past Surgical History:  Procedure Laterality Date  . CHOLECYSTECTOMY    . LITHOTRIPSY    . TUBAL LIGATION     No Known Allergies No current facility-administered medications on file prior to encounter.   Current Outpatient Medications on File Prior to Encounter  Medication Sig Dispense Refill  . fluticasone (FLONASE) 50 MCG/ACT nasal spray Place 1 spray into both nostrils daily for 14 days. 16 g 0  . HYDROcodone-acetaminophen (NORCO/VICODIN) 5-325 MG per tablet 1 or 2 tabs PO q6 hours prn pain 20 tablet 0  . ibuprofen (ADVIL,MOTRIN) 200 MG tablet Take 800 mg by mouth every 6 (six) hours as needed for moderate pain.    Marland Kitchen lisinopril (ZESTRIL) 20 MG tablet Take 20 mg by mouth daily.    . methocarbamol (ROBAXIN) 500 MG tablet Take 2 tablets (1,000 mg total) by mouth 4 (four) times daily as needed for muscle spasms  (muscle spasm/pain). 25 tablet 0  . naproxen (NAPROSYN) 250 MG tablet Take 1 tablet (250 mg total) by mouth 2 (two) times daily as needed for mild pain or moderate pain (take with food). 14 tablet 0  . naproxen sodium (ANAPROX) 220 MG tablet Take 220 mg by mouth daily as needed (pain).     Social History   Socioeconomic History  . Marital status: Single    Spouse name: Not on file  . Number of children: Not on file  . Years of education: Not on file  . Highest education level: Not on file  Occupational History  . Not on file  Tobacco Use  . Smoking status: Current Every Day Smoker    Packs/day: 1.00    Types: Cigarettes  Substance and Sexual Activity  . Alcohol use: No  . Drug use: No  . Sexual activity: Not on file  Other Topics Concern  . Not on file  Social History Narrative  . Not on file   Social Determinants of Health   Financial Resource Strain:   . Difficulty of Paying Living Expenses:   Food Insecurity:   . Worried About Charity fundraiser in the Last Year:   . Arboriculturist in the Last Year:   Transportation Needs:   . Film/video editor (Medical):   Marland Kitchen Lack of Transportation (Non-Medical):   Physical Activity:   . Days  of Exercise per Week:   . Minutes of Exercise per Session:   Stress:   . Feeling of Stress :   Social Connections:   . Frequency of Communication with Friends and Family:   . Frequency of Social Gatherings with Friends and Family:   . Attends Religious Services:   . Active Member of Clubs or Organizations:   . Attends Archivist Meetings:   Marland Kitchen Marital Status:   Intimate Partner Violence:   . Fear of Current or Ex-Partner:   . Emotionally Abused:   Marland Kitchen Physically Abused:   . Sexually Abused:    Family History  Problem Relation Age of Onset  . Heart failure Mother   . Diabetes Father   . Hypertension Father     OBJECTIVE: BP: 113/78 HR: 80  Temp: 98.0  General appearance: alert; no distress Head: NCAT; erythema  and swelling over bilateral cheeks and nose, some clear drainage appreciated over RT nasolabial fold, NTTP, no warmth to the touch, no open wounds or bleeding (see pictures below) ENT: EACs clear, TMs pearly gray; nares patent, LT turbinate swollen and erythematous; oropharynx clear, tolerating own secretions, poor to fair dentition, no obvious abscess Lungs: clear to auscultation bilaterally Heart: regular rate and rhythm.   Extremities: no edema Skin: warm and dry; (see pictures below) Psychological: alert and cooperative; normal mood and affect       ASSESSMENT & PLAN:  1. Facial rash   2. Facial swelling   3. Cellulitis of face     Meds ordered this encounter  Medications  . amoxicillin-clavulanate (AUGMENTIN) 875-125 MG tablet    Sig: Take 1 tablet by mouth every 12 (twelve) hours for 10 days.    Dispense:  20 tablet    Refill:  0    Order Specific Question:   Supervising Provider    Answer:   Raylene Everts [id number]  . predniSONE (STERAPRED UNI-PAK 21 TAB) 10 MG (21) TBPK tablet    Sig: Take by mouth daily. Take 6 tabs by mouth daily  for 2 days, then 5 tabs for 2 days, then 4 tabs for 2 days, then 3 tabs for 2 days, 2 tabs for 2 days, then 1 tab by mouth daily for 2 days    Dispense:  42 tablet    Refill:  0    Order Specific Question:   Supervising Provider    Answer:   Raylene Everts [id number]  . dexamethasone (DECADRON) injection 10 mg   Steroid shot given in office Augmentin prescribed.  Take as directed and to completion Prednsione prescribed.  Take as directed and to completion Wash with warm water and mild soap Avoid scratching or picking at rash Follow up with PCP for recheck and to ensure symptoms are improving Return or go to the ER if you have any new or worsening symptoms such as fever, chills, nausea, vomiting, redness, swelling, discharge, if symptoms do not improve with medications, etc...  Reviewed expectations re: course of current medical  issues. Questions answered. Outlined signs and symptoms indicating need for more acute intervention. Patient verbalized understanding. After Visit Summary given.   Lestine Box, PA-C 10/25/19 [place], [place], [place] 10/25/19 [phone]

## 2019-10-25 NOTE — ED Triage Notes (Signed)
Pt seen by provider prior to RN , see provider notes

## 2019-10-25 NOTE — Discharge Instructions (Signed)
Steroid shot given in office Augmentin prescribed.  Take as directed and to completion Prednsione prescribed.  Take as directed and to completion Wash with warm water and mild soap Avoid scratching or picking at rash Follow up with PCP for recheck and to ensure symptoms are improving Return or go to the ER if you have any new or worsening symptoms such as fever, chills, nausea, vomiting, redness, swelling, discharge, if symptoms do not improve with medications, etc..Marland Kitchen

## 2019-11-21 ENCOUNTER — Emergency Department (HOSPITAL_COMMUNITY): Payer: No Typology Code available for payment source

## 2019-11-21 ENCOUNTER — Emergency Department (HOSPITAL_COMMUNITY)
Admission: EM | Admit: 2019-11-21 | Discharge: 2019-11-21 | Disposition: A | Discharge status: Home / Self Care | Payer: No Typology Code available for payment source | Attending: Emergency Medicine | Admitting: Emergency Medicine

## 2019-11-21 ENCOUNTER — Encounter (HOSPITAL_COMMUNITY): Payer: Self-pay | Admitting: Emergency Medicine

## 2019-11-21 ENCOUNTER — Other Ambulatory Visit: Payer: Self-pay

## 2019-11-21 DIAGNOSIS — Z79899 Other long term (current) drug therapy: Secondary | ICD-10-CM | POA: Diagnosis not present

## 2019-11-21 DIAGNOSIS — Y92410 Unspecified street and highway as the place of occurrence of the external cause: Secondary | ICD-10-CM | POA: Diagnosis not present

## 2019-11-21 DIAGNOSIS — F17210 Nicotine dependence, cigarettes, uncomplicated: Secondary | ICD-10-CM | POA: Diagnosis not present

## 2019-11-21 DIAGNOSIS — Y93I9 Activity, other involving external motion: Secondary | ICD-10-CM | POA: Insufficient documentation

## 2019-11-21 DIAGNOSIS — Y999 Unspecified external cause status: Secondary | ICD-10-CM | POA: Diagnosis not present

## 2019-11-21 DIAGNOSIS — M791 Myalgia, unspecified site: Secondary | ICD-10-CM | POA: Insufficient documentation

## 2019-11-21 DIAGNOSIS — Z23 Encounter for immunization: Secondary | ICD-10-CM | POA: Insufficient documentation

## 2019-11-21 DIAGNOSIS — M7918 Myalgia, other site: Secondary | ICD-10-CM

## 2019-11-21 DIAGNOSIS — M25511 Pain in right shoulder: Secondary | ICD-10-CM | POA: Diagnosis present

## 2019-11-21 MED ORDER — TETANUS-DIPHTH-ACELL PERTUSSIS 5-2.5-18.5 LF-MCG/0.5 IM SUSP
0.5000 mL | Freq: Once | INTRAMUSCULAR | Status: AC
  Administered 2019-11-21: 0.5 mL via INTRAMUSCULAR
  Filled 2019-11-21: qty 0.5

## 2019-11-21 MED ORDER — NAPROXEN 500 MG PO TABS: 500.0000 mg | ORAL_TABLET | Freq: Two times a day (BID) | ORAL | 0 refills | Status: DC

## 2019-11-21 MED ORDER — METHOCARBAMOL 500 MG PO TABS: 500.0000 mg | ORAL_TABLET | Freq: Two times a day (BID) | ORAL | 0 refills | Status: DC
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
## 2020-02-04 ENCOUNTER — Other Ambulatory Visit: Payer: Self-pay

## 2020-02-04 ENCOUNTER — Encounter (INDEPENDENT_AMBULATORY_CARE_PROVIDER_SITE_OTHER): Payer: Self-pay | Admitting: Nurse Practitioner

## 2020-02-04 ENCOUNTER — Ambulatory Visit (INDEPENDENT_AMBULATORY_CARE_PROVIDER_SITE_OTHER): Payer: 59 | Admitting: Nurse Practitioner

## 2020-02-04 VITALS — BP 145/90 | HR 74 | Temp 97.5°F | Ht 68.0 in | Wt 206.6 lb

## 2020-02-04 DIAGNOSIS — I10 Essential (primary) hypertension: Secondary | ICD-10-CM

## 2020-02-04 DIAGNOSIS — K5900 Constipation, unspecified: Secondary | ICD-10-CM

## 2020-02-04 DIAGNOSIS — E785 Hyperlipidemia, unspecified: Secondary | ICD-10-CM | POA: Diagnosis not present

## 2020-02-04 DIAGNOSIS — E669 Obesity, unspecified: Secondary | ICD-10-CM

## 2020-02-04 DIAGNOSIS — Z720 Tobacco use: Secondary | ICD-10-CM

## 2020-02-04 DIAGNOSIS — R5383 Other fatigue: Secondary | ICD-10-CM

## 2020-02-04 MED ORDER — LISINOPRIL 5 MG PO TABS: 5.0000 mg | ORAL_TABLET | Freq: Every day | ORAL | 0 refills | Status: DC

## 2020-02-04 NOTE — Progress Notes (Signed)
Subjective:  Patient ID: Mackenzie Lynch, female    DOB: [birth year]  Age: 58 y.o. MRN: [id number]  CC:  Chief Complaint  Patient presents with  . Hypertension  . Hyperlipidemia  . Establish Care  . Other    Hypotension/Fatigue      HPI  This 59 year old female comes in today to establish care at this office.  She tells me she is coming here because she would just like to have a new medical provider for her primary care.  She has a past medical history significant for hypertension, hyperlipidemia, unspecified kidney disease.  She is a current tobacco smoker.  Her main complaint today is that she has been feeling episodes of fatigue, and did experience an episode of hypotension last week.  She tells me she went to her chiropractor, and blood pressure was taken at that time and she was told that her blood pressure was 86/60.  She tells me she has had a couple of episodes of lightheadedness over the last week.  After her appoint with her chiropractor she did follow-up with her primary care provider on 01/27/20.  She tells me at that visit she was told her blood pressure was normal.  She also had blood work taken at that visit.  I do not have access to the blood work results, however she does show me the results from her telephone.  I do see that cholesterol panel was checked which did show total cholesterol 206, HDL 57, triglycerides 75, and LDL 132.  He tells me she was fasting at that time.  She also had a comprehensive medical panel drawn and this was completely normal, she had a CBC collected which did show mildly elevated white blood cells  (11.5) but otherwise was normal, a normal vitamin D level, and normal TSH.  Since that episode she has had some lingering fatigue but otherwise has felt well.  She does mention that she feels that she suffers from chronic constipation.  She tells me she can sometimes go up to 2 weeks without having a bowel movement, and she always feels bloated.  She has  tried over-the-counter stool softener with mild improvement.  She admits to not drinking enough fluid during the day.  She is not on any chronic medications currently, but tells me she was on lisinopril in the past.   Past Medical History:  Diagnosis Date  . Hyperlipidemia   . Hypertension   . Kidney stones       Family History  Problem Relation Age of Onset  . Heart failure Mother   . Diabetes Father   . Hypertension Father     Social History   Social History Narrative  . Not on file   Social History   Tobacco Use  . Smoking status: Current Every Day Smoker    Packs/day: 1.00    Types: Cigarettes  . Smokeless tobacco: Never Used  Substance Use Topics  . Alcohol use: No     No outpatient medications have been marked as taking for the 02/04/20 encounter (Office Visit) with Ailene Ards, NP.    ROS:  Review of Systems  Constitutional: Positive for malaise/fatigue. Negative for chills and fever.  Respiratory: Positive for cough. Negative for shortness of breath.   Cardiovascular: Negative for chest pain and palpitations.  Neurological: Positive for dizziness.     Objective:   Today's Vitals: BP (!) 145/90 (BP Location: Left Arm, Patient Position: Sitting, Cuff Size: Normal)  Pulse 74   Temp (!) 97.5 F (36.4 C) (Temporal)   Ht 5\' 8"  (1.727 m)   Wt 206 lb 9.6 oz (93.7 kg)   LMP 04/25/2014   SpO2 97%   BMI 31.41 kg/m  Vitals with BMI 02/04/2020 11/21/2019 08/26/2019  Height 5\' 8"  5\' 8"  -  Weight 206 lbs 10 oz 210 lbs -  BMI 62.37 62.83 -  Systolic 151 761 607  Diastolic 90 79 78  Pulse 74 83 82     Physical Exam Vitals reviewed.  Constitutional:      General: She is not in acute distress.    Appearance: Normal appearance.  HENT:     Head: Normocephalic and atraumatic.  Neck:     Vascular: No carotid bruit.  Cardiovascular:     Rate and Rhythm: Normal rate and regular rhythm.     Pulses: Normal pulses.     Heart sounds: Normal heart sounds.   Pulmonary:     Effort: Pulmonary effort is normal.     Breath sounds: Normal breath sounds.  Skin:    General: Skin is warm and dry.  Neurological:     General: No focal deficit present.     Mental Status: She is alert and oriented to person, place, and time.  Psychiatric:        Mood and Affect: Mood normal.        Behavior: Behavior normal.        Judgment: Judgment normal.       EKG: Sinus rhythm, QS wave in V1, inverted T wave in V1-V3   Assessment and Plan   1. Hypertension, unspecified type   2. Hyperlipidemia, unspecified hyperlipidemia type   3. Tobacco abuse   4. Fatigue, unspecified type   5. Obesity (BMI 30-39.9)   6. Constipation, unspecified constipation type      Plan: I will restart her on her lisinopril today to control her blood pressure.  We will start her on 5 mg daily, she will return the office in about 2 weeks to have metabolic panel rechecked also consider checking A1c at that time.  If her symptoms of fatigue persist may need to refer her to cardiology as she did have a couple of inverted T waves on EKG.  I have encouraged her to try to stop smoking, may need to consider restarting Chantix in the future to help her with smoking cessation.  We also discussed diet and lifestyle and at helping her with weight loss and controlling her hyperlipidemia.   Tests ordered No orders of the defined types were placed in this encounter.     No orders of the defined types were placed in this encounter.   Patient to follow-up in 2 weeks  Ailene Ards, NP

## 2020-02-04 NOTE — Patient Instructions (Signed)
Senna-S: Take 1-2 tablets by mouth at bedtime as needed for constipation.  [facility] Dietary Recommendations for Weight Loss What to Avoid . Avoid added sugars o Often added sugar can be found in processed foods such as many condiments, dry cereals, cakes, cookies, chips, crisps, crackers, candies, sweetened drinks, etc.  o Read labels and AVOID/DECREASE use of foods with the following in their ingredient list: Sugar, fructose, high fructose corn syrup, sucrose, glucose, maltose, dextrose, molasses, cane sugar, brown sugar, any type of syrup, agave nectar, etc.   . Avoid snacking in between meals . Avoid foods made with flour o If you are going to eat food made with flour, choose those made with whole-grains; and, minimize your consumption as much as is tolerable . Avoid processed foods o These foods are generally stocked in the middle of the grocery store. Focus on shopping on the perimeter of the grocery.  . Avoid Meat  o We recommend following a plant-based diet at [facility]. Thus, we recommend avoiding meat as a general rule. Consider eating beans, legumes, eggs, and/or dairy products for regular protein sources o If you plan on eating meat limit to 4 ounces of meat at a time and choose lean options such as Fish, chicken, [place]. Avoid red meat intake such as pork and/or steak What to Include . Vegetables o GREEN LEAFY VEGETABLES: Kale, spinach, mustard greens, collard greens, cabbage, broccoli, etc. o OTHER: Asparagus, cauliflower, eggplant, carrots, peas, Brussel sprouts, tomatoes, bell peppers, zucchini, beets, cucumbers, etc. . Grains, seeds, and legumes o Beans: kidney beans, black eyed peas, garbanzo beans, black beans, pinto beans, etc. o Whole, unrefined grains: brown rice, barley, bulgur, oatmeal, etc. . Healthy fats  o Avoid highly processed fats such as vegetable oil o Examples of healthy fats: avocado, olives, virgin olive oil, dark chocolate (?72%  Cocoa), nuts (peanuts, almonds, walnuts, cashews, pecans, etc.) . None to Low Intake of Animal Sources of Protein o Meat sources: chicken, [place], salmon, tuna. Limit to 4 ounces of meat at one time. o Consider limiting dairy sources, but when choosing dairy focus on: PLAIN [place] yogurt, cottage cheese, high-protein milk . Fruit o Choose berries  When to Eat . Intermittent Fasting: o Choosing not to eat for a specific time period, but DO FOCUS ON HYDRATION when fasting o Multiple Techniques: - Time Restricted Eating: eat 3 meals in a day, each meal lasting no more than 60 minutes, no snacks between meals - 16-18 hour fast: fast for 16 to 18 hours up to 7 days a week. Often suggested to start with 2-3 nonconsecutive days per week.  . Remember the time you sleep is counted as fasting.  . Examples of eating schedule: Fast from 7:00pm-11:00am. Eat between 11:00am-7:00pm.  - 24-hour fast: fast for 24 hours up to every other day. Often suggested to start with 1 day per week . Remember the time you sleep is counted as fasting . Examples of eating schedule:  o Eating day: eat 2-3 meals on your eating day. If doing 2 meals, each meal should last no more than 90 minutes. If doing 3 meals, each meal should last no more than 60 minutes. Finish last meal by 7:00pm. o Fasting day: Fast until 7:00pm.  o IF YOU FEEL UNWELL FOR ANY REASON/IN ANY WAY WHEN FASTING, STOP FASTING BY EATING A NUTRITIOUS SNACK OR LIGHT MEAL o ALWAYS FOCUS ON HYDRATION DURING FASTS - Acceptable Hydration sources: water, broths, tea/coffee (black tea/coffee is best but using  a small amount of whole-fat dairy products in coffee/tea is acceptable).  - Poor Hydration Sources: anything with sugar or artificial sweeteners added to it  These recommendations have been developed for patients that are actively receiving medical care from either Dr. Anastasio Champion or Jeralyn Ruths, DNP, NP-C at [facility]. These recommendations are developed  for patients with specific medical conditions and are not meant to be distributed or used by others that are not actively receiving care from either provider listed above at [facility]. It is not appropriate to participate in the above eating plans without proper medical supervision.   Reference: Rexanne Mano. The obesity code. [place]; 2016.

## 2020-02-22 ENCOUNTER — Ambulatory Visit (INDEPENDENT_AMBULATORY_CARE_PROVIDER_SITE_OTHER): Payer: 59 | Admitting: Nurse Practitioner

## 2020-02-22 ENCOUNTER — Other Ambulatory Visit: Payer: Self-pay

## 2020-02-22 ENCOUNTER — Encounter (INDEPENDENT_AMBULATORY_CARE_PROVIDER_SITE_OTHER): Payer: Self-pay | Admitting: Nurse Practitioner

## 2020-02-22 VITALS — BP 115/80 | HR 84 | Temp 96.9°F | Ht 68.0 in | Wt 202.6 lb

## 2020-02-22 DIAGNOSIS — R9431 Abnormal electrocardiogram [ECG] [EKG]: Secondary | ICD-10-CM

## 2020-02-22 DIAGNOSIS — Z1211 Encounter for screening for malignant neoplasm of colon: Secondary | ICD-10-CM | POA: Diagnosis not present

## 2020-02-22 DIAGNOSIS — Z1231 Encounter for screening mammogram for malignant neoplasm of breast: Secondary | ICD-10-CM | POA: Diagnosis not present

## 2020-02-22 DIAGNOSIS — Z131 Encounter for screening for diabetes mellitus: Secondary | ICD-10-CM

## 2020-02-22 DIAGNOSIS — Z124 Encounter for screening for malignant neoplasm of cervix: Secondary | ICD-10-CM | POA: Diagnosis not present

## 2020-02-22 DIAGNOSIS — I10 Essential (primary) hypertension: Secondary | ICD-10-CM

## 2020-02-22 NOTE — Progress Notes (Signed)
Subjective:  Patient ID: Mackenzie Lynch, female    DOB: [birth year]  Age: 59 y.o. MRN: [id number]  CC:  Chief Complaint  Patient presents with  . Hypertension  . Follow-up    Lightheadedness, constipation, health maintenance      HPI  This patient comes in today for the above.  At her last office visit she was establishing care in the office.  At that time she had discussed an episode of dizziness and hypotension.  She was found to be hypertensive at her last office visit.  I did start her on lisinopril at that time she is tolerating the medication well.  She is due to have metabolic panel checked today.  She tells me generally she has not had a significant dizzy spells since her last office visit, however she did experience some lightheadedness approximately 3 days ago that lasted most of the day.  It did eventually go away on its own.  She tells me that she feels often when she goes from laying down to sitting or standing she will feel lightheaded.  EKG was collected last office visit we did show inverted T waves in leads V1 to V3.  She does smoke cigarettes and is interested in taking Chantix to help with smoking cessation.  She has taken it in the past and is tolerated it well.  She denies any chest pain, she does have family history of coronary artery disease including a sibling and her mother.  Both of which have experienced a myocardial infarction.  She also mention constipation at her last office visit.  I had recommended she try senna S as needed and she tells me that she has been experiencing much improvement in her bowel movements.  She has not been experiencing any unintentional weight loss, her symptoms have been ongoing for most of her life and are not new onset, she was not noted to be anemic when she had blood work done at her previous primary care providers.  Regarding health maintenance she is due for cervical cancer screening via Pap smear and colon cancer screening.  She is  also due for diabetes screening.   Past Medical History:  Diagnosis Date  . Hyperlipidemia   . Hypertension   . Kidney stones       Family History  Problem Relation Age of Onset  . Heart failure Mother   . Diabetes Father   . Hypertension Father     Social History   Social History Narrative  . Not on file   Social History   Tobacco Use  . Smoking status: Current Every Day Smoker    Packs/day: 1.00    Types: Cigarettes  . Smokeless tobacco: Never Used  Substance Use Topics  . Alcohol use: No     Current Meds  Medication Sig  . lisinopril (ZESTRIL) 5 MG tablet Take 1 tablet (5 mg total) by mouth daily.    ROS:  Review of Systems  Constitutional: Negative for weight loss.  Respiratory: Negative for cough and shortness of breath.   Cardiovascular: Negative for chest pain and palpitations.  Gastrointestinal: Positive for constipation. Negative for blood in stool and melena.  Neurological: Negative for dizziness.     Objective:   Today's Vitals: BP 115/80 (BP Location: Left Arm, Patient Position: Sitting, Cuff Size: Normal)   Pulse 84   Temp (!) 96.9 F (36.1 C) (Temporal)   Ht '5\' 8"'$  (1.727 m)   Wt 202 lb 9.6 oz (91.9  kg)   LMP 04/25/2014   SpO2 93%   BMI 30.81 kg/m  Vitals with BMI 02/22/2020 02/04/2020 11/21/2019  Height '5\' 8"'$  '5\' 8"'$  '5\' 8"'$   Weight 202 lbs 10 oz 206 lbs 10 oz 210 lbs  BMI 30.81 66.59 93.57  Systolic 017 793 903  Diastolic 80 90 79  Pulse 84 74 83     Physical Exam Vitals reviewed.  Constitutional:      General: She is not in acute distress.    Appearance: Normal appearance.  HENT:     Head: Normocephalic and atraumatic.  Neck:     Vascular: No carotid bruit.  Cardiovascular:     Rate and Rhythm: Normal rate and regular rhythm.     Pulses: Normal pulses.     Heart sounds: Normal heart sounds.  Pulmonary:     Effort: Pulmonary effort is normal.     Breath sounds: Normal breath sounds.  Skin:    General: Skin is warm and  dry.  Neurological:     General: No focal deficit present.     Mental Status: She is alert and oriented to person, place, and time.  Psychiatric:        Mood and Affect: Mood normal.        Behavior: Behavior normal.        Judgment: Judgment normal.    Orthostatic vital signs: Sitting 108/72; standing 0 minutes 130/80      Assessment and Plan   1. Cervical cancer screening   2. Colon cancer screening   3. Screening for diabetes mellitus   4. Encounter for screening mammogram for malignant neoplasm of breast   5. Abnormal EKG   6. Hypertension, unspecified type      Plan: 1.  She will go for referral to OB/GYN for cervical cancer screening.  2.  She would like to hold off on colonoscopy if possible, but is open to doing fecal immunoglobulin testing to screen for colon cancer.  She is aware that if it comes back abnormal recommendation would be to undergo colonoscopy, and she is willing to do this if necessary.  She does not have any family history of colon cancer.  3.  We will collect A1c for further evaluation.  4.  I will order mammogram to screen for breast cancer.  5.  Due to her symptoms of dizziness, lightheadedness, fatigue, and the fact that she is a current cigarette smoker with positive family history of coronary artery disease and she did have an abnormal EKG at last office visit I will send her to cardiology for evaluation.  If evaluation comes back benign we will consider restarting her Chantix to help her with smoking cessation.  6.  She will continue on her lisinopril and we will collect metabolic panel today for further evaluation.   Tests ordered Orders Placed This Encounter  Procedures  . MM Digital Diagnostic Bilat  . Fecal Globin By Immunochemistry  . Hemoglobin A1c  . CMP with eGFR([facility])  . Ambulatory referral to Obstetrics / Gynecology  . Ambulatory referral to Cardiology      No orders of the defined types were placed in this  encounter.   Patient to follow-up in 3 months with Dr. Anastasio Champion or sooner as needed.  Ailene Ards, NP

## 2020-02-22 NOTE — Addendum Note (Signed)
Addended by: Jeralyn Ruths E on: 02/22/2020 04:00 PM   Modules accepted: Orders

## 2020-02-23 LAB — HEMOGLOBIN A1C
Hgb A1c MFr Bld: 5.4 % of total Hgb (ref <5.7)
Mean Plasma Glucose: 108 (calc)
eAG (mmol/L): 6 (calc)

## 2020-02-23 LAB — COMPLETE METABOLIC PANEL WITH GFR
AG Ratio: 1.4 (calc) (ref 1.0–2.5)
ALT: 15 U/L (ref 6–29)
AST: 19 U/L (ref 10–35)
Albumin: 4.3 g/dL (ref 3.6–5.1)
Alkaline phosphatase (APISO): 87 U/L (ref 37–153)
BUN: 17 mg/dL (ref 7–25)
CO2: 28 mmol/L (ref 20–32)
Calcium: 10 mg/dL (ref 8.6–10.4)
Chloride: 100 mmol/L (ref 98–110)
Creat: 0.95 mg/dL (ref 0.50–1.05)
GFR, Est African American: 78 mL/min/{1.73_m2} (ref >=60)
GFR, Est Non African American: 67 mL/min/{1.73_m2} (ref >=60)
Globulin: 3 g/dL (calc) (ref 1.9–3.7)
Glucose, Bld: 102 mg/dL — ABNORMAL HIGH (ref 65–99)
Potassium: 4.3 mmol/L (ref 3.5–5.3)
Sodium: 138 mmol/L (ref 135–146)
Total Bilirubin: 0.4 mg/dL (ref 0.2–1.2)
Total Protein: 7.3 g/dL (ref 6.1–8.1)

## 2020-02-25 ENCOUNTER — Ambulatory Visit (HOSPITAL_COMMUNITY)
Admission: RE | Admit: 2020-02-25 | Discharge: 2020-02-25 | Disposition: A | Discharge status: Home / Self Care | Payer: 59 | Source: Ambulatory Visit | Attending: Nurse Practitioner | Admitting: Nurse Practitioner

## 2020-02-25 ENCOUNTER — Other Ambulatory Visit: Payer: Self-pay

## 2020-02-25 DIAGNOSIS — Z1231 Encounter for screening mammogram for malignant neoplasm of breast: Secondary | ICD-10-CM | POA: Insufficient documentation

## 2020-02-29 ENCOUNTER — Other Ambulatory Visit (HOSPITAL_COMMUNITY): Payer: Self-pay | Admitting: Nurse Practitioner

## 2020-02-29 DIAGNOSIS — R928 Other abnormal and inconclusive findings on diagnostic imaging of breast: Secondary | ICD-10-CM

## 2020-03-07 ENCOUNTER — Telehealth (INDEPENDENT_AMBULATORY_CARE_PROVIDER_SITE_OTHER): Payer: Self-pay | Admitting: Nurse Practitioner

## 2020-03-07 NOTE — Telephone Encounter (Signed)
She is stating that she is going to give the stool cards done by Thursday if she hasnt stopped bleeding by then she will call us back to get the referral to [facility]

## 2020-03-07 NOTE — Telephone Encounter (Signed)
Russell stated that she has not due to she is having blood in her stool due to hemorrhoids and she knows that would make her test positive when she knows she is having problems with hemorrhoids at this time, please advise?

## 2020-03-07 NOTE — Telephone Encounter (Signed)
Please call this patient and ask if she has completed her stool card and if so she needs to bring it back to our office so that we can send sample for testing.  We need these results in order to complete her paperwork for her work.  Thank you.

## 2020-03-07 NOTE — Telephone Encounter (Signed)
I suppose she has 2 options. She could wait until the bleeding from her hemorrhoid has subsided and then do the screening card, or we can send her to gastroenterology for discussion regarding other options for colon cancer screening. I recommend that she do colon cancer screening because it is important to have this completed on every eligible individual, but also so that I can sign off on her paperwork she needs for work. Please let me know if she would like referral to gastroenterology, and if so let me know if she has any specific office she would prefer to be seen at.

## 2020-03-08 ENCOUNTER — Other Ambulatory Visit: Payer: Self-pay

## 2020-03-08 ENCOUNTER — Ambulatory Visit (HOSPITAL_COMMUNITY)
Admission: RE | Admit: 2020-03-08 | Discharge: 2020-03-08 | Disposition: A | Discharge status: Home / Self Care | Payer: 59 | Source: Ambulatory Visit | Attending: Nurse Practitioner | Admitting: Nurse Practitioner

## 2020-03-08 DIAGNOSIS — R928 Other abnormal and inconclusive findings on diagnostic imaging of breast: Secondary | ICD-10-CM | POA: Diagnosis not present

## 2020-03-09 ENCOUNTER — Other Ambulatory Visit (INDEPENDENT_AMBULATORY_CARE_PROVIDER_SITE_OTHER): Payer: Self-pay | Admitting: Nurse Practitioner

## 2020-03-09 DIAGNOSIS — R195 Other fecal abnormalities: Secondary | ICD-10-CM

## 2020-03-09 LAB — FECAL GLOBIN BY IMMUNOCHEMISTRY: FECAL GLOBIN RESULT:: DETECTED — AB

## 2020-03-09 NOTE — Progress Notes (Signed)
Order for referral to gastroenterology for abnormal fit test

## 2020-03-09 NOTE — Progress Notes (Signed)
Noted Mackenzie Lynch is aware

## 2020-03-21 ENCOUNTER — Encounter (INDEPENDENT_AMBULATORY_CARE_PROVIDER_SITE_OTHER): Payer: Self-pay | Admitting: Gastroenterology

## 2020-03-21 ENCOUNTER — Telehealth (INDEPENDENT_AMBULATORY_CARE_PROVIDER_SITE_OTHER): Payer: Self-pay

## 2020-03-21 ENCOUNTER — Ambulatory Visit (INDEPENDENT_AMBULATORY_CARE_PROVIDER_SITE_OTHER): Payer: 59 | Admitting: Gastroenterology

## 2020-03-21 ENCOUNTER — Other Ambulatory Visit: Payer: Self-pay

## 2020-03-21 DIAGNOSIS — K5900 Constipation, unspecified: Secondary | ICD-10-CM | POA: Insufficient documentation

## 2020-03-21 DIAGNOSIS — K5909 Other constipation: Secondary | ICD-10-CM

## 2020-03-21 DIAGNOSIS — R195 Other fecal abnormalities: Secondary | ICD-10-CM | POA: Diagnosis not present

## 2020-03-21 MED ORDER — PROMETHAZINE HCL 25 MG PO TABS: 25.0000 mg | ORAL_TABLET | Freq: Four times a day (QID) | ORAL | 0 refills | Status: DC | PRN

## 2020-03-21 NOTE — Progress Notes (Signed)
Mackenzie Lynch, M.D. Gastroenterology & Hepatology [facility] [address] [place], [place] [address] Primary Care Physician: Mackenzie Ards, NP [place] [address]  Referring MD: PCP  I will communicate my assessment and recommendations to the referring MD via EMR. Note: Occasional unusual wording and randomly placed punctuation marks may result from the use of speech recognition technology to transcribe this document"  Chief Complaint: Positive FIT  History of Present Illness: Mackenzie Lynch is a 59 y.o. female with past medical history of hyperlipidemia and hypertension who presents for evaluation of positive FIT.  The patient endorses having a longstanding history of constipation, for which she recently started taking senna for the past month.  She reports that in the past she used to have a bowel movement every 1 to 2 weeks which led to significant straining and episodes of rectal bleeding intermittently for years.  States that she had rectal bleeding usually twice a month.  However, she reports that she started taking senna 2 tabs per day and this has led to softer bowel movements without straining on a daily basis.  She has not presented any more bleeding episodes since then. The patient denies having any nausea, vomiting, fever, chills, melena, hematemesis, abdominal distention, abdominal pain, diarrhea, jaundice, pruritus or weight loss.  The patient had a positive FIT testing on 03/08/2020.  Last Colonoscopy: Close to 10 years ago per the patient, was found to have hemorrhoids only.  She had an EGD same day which was unremarkable per the patient.  FHx: neg for any gastrointestinal/liver disease, no malignancies Social: smokes a pack day, neg alcohol or illicit drug use Surgical: gallbladder sx, cystoscopy with lithotripsy  Past Medical History: Past Medical History:  Diagnosis Date  . Hyperlipidemia   .  Hypertension   . Kidney stones     Past Surgical History: Past Surgical History:  Procedure Laterality Date  . CHOLECYSTECTOMY    . LITHOTRIPSY    . TUBAL LIGATION      Family History: Family History  Problem Relation Age of Onset  . Heart failure Mother   . Diabetes Father   . Hypertension Father     Social History: Social History   Tobacco Use  Smoking Status Current Every Day Smoker  . Packs/day: 1.00  . Types: Cigarettes  Smokeless Tobacco Never Used   Social History   Substance and Sexual Activity  Alcohol Use No   Social History   Substance and Sexual Activity  Drug Use No    Allergies: Allergies  Allergen Reactions  . Sulfur Nausea Only  . Sulfa Antibiotics     Medications: Current Outpatient Medications  Medication Sig Dispense Refill  . etodolac (LODINE) 400 MG tablet Take 400 mg by mouth 2 (two) times daily.    Marland Kitchen lisinopril (ZESTRIL) 5 MG tablet Take 1 tablet (5 mg total) by mouth daily. 90 tablet 0  . Sennosides-Docusate Sodium (SENNA S PO) Take by mouth at bedtime.     No current facility-administered medications for this visit.    Review of Systems: GENERAL: negative for malaise, night sweats HEENT: No changes in hearing or vision, no nose bleeds or other nasal problems. NECK: Negative for lumps, goiter, pain and significant neck swelling RESPIRATORY: Negative for cough, wheezing CARDIOVASCULAR: Negative for chest pain, leg swelling, palpitations, orthopnea GI: SEE HPI MUSCULOSKELETAL: Negative for joint pain or swelling, back pain, and muscle pain. SKIN: Negative for lesions, rash PSYCH: Negative for  sleep disturbance, mood disorder and recent psychosocial stressors. HEMATOLOGY Negative for prolonged bleeding, bruising easily, and swollen nodes. ENDOCRINE: Negative for cold or heat intolerance, polyuria, polydipsia and goiter. NEURO: negative for tremor, gait imbalance, syncope and seizures. The remainder of the review of systems is  noncontributory.  Physical Exam: BP 95/85 (BP Location: Right Arm, Patient Position: Sitting, Cuff Size: Large)   Pulse 82   Temp 99 F (37.2 C) (Oral)   Ht 5\' 8"  (1.727 m)   Wt 201 lb 8 oz (91.4 kg)   LMP 04/25/2014   BMI 30.64 kg/m  GENERAL: The patient is AO x3, in no acute distress. HEENT: Head is normocephalic and atraumatic. EOMI are intact. Mouth is well hydrated and without lesions. NECK: Supple. No masses LUNGS: Clear to auscultation. No presence of rhonchi/wheezing/rales. Adequate chest expansion HEART: RRR, normal s1 and s2. ABDOMEN: Soft, nontender, no guarding, no peritoneal signs, and nondistended. BS +. No masses. EXTREMITIES: Without any cyanosis, clubbing, rash, lesions or edema. NEUROLOGIC: AOx3, no focal motor deficit. SKIN: no jaundice, no rashes   Imaging/Labs: as above  I personally reviewed and interpreted the available labs, imaging and endoscopic files.  Impression and Plan: Mackenzie Lynch is a 59 y.o. female with past medical history of hyperlipidemia and hypertension who presents for evaluation of positive FIT. No family history of colon cancer. The patient is at average risk for colorectal cancer.    Patient had history of chronic constipation which has improved with the use of Senna, without any more episodes of rectal bleeding recently.  She would benefit from implementing dietary changes.  We will proceed with colonoscopy for evaluation of positive FIT.  We will also check CBC and iron stores as the patient had unclear history of anemia.  More than 50% of the office visit was dedicated to discussing the procedure, including the day of and risks involved. Patient understands what the procedure involves including the benefits and any risks. Patient understands alternatives to the proposed procedure. Risks including (but not limited to) bleeding, tearing of the lining (perforation), rupture of adjacent organs, problems with heart and lung function, infection,  and medication reactions. A small percentage of complications may require surgery, hospitalization, repeat endoscopic procedure, and/or transfusion. A small percentage of polyps and other tumors may not be seen.  - Schedule colonoscopy - Check CBC and iron stores - Start prune juice and eat kiwi daily  All questions were answered.      Mackenzie Peppers, MD Gastroenterology and Hepatology [facility]

## 2020-03-21 NOTE — Telephone Encounter (Signed)
Mackenzie Lynch is aware of the recommendation and agrees

## 2020-03-21 NOTE — Telephone Encounter (Signed)
If she is feeling okay and is not experiencing any lightheadedness, dizziness, shortness of breath, chest pain, headache, visual changes, etc. then I would like for her to just monitor her blood pressure at home if possible.  Thus, if she does not already have one she should pick up a blood pressure cuff from her pharmacy or drugstore.  She should check her blood pressure once a day and keep a log of this for [place] to evaluate.  If she is feeling unwell she should proceed to an urgent care or could possibly be seen here today if we have any openings.  Please let me know how she is feeling when you call her back.

## 2020-03-21 NOTE — Patient Instructions (Addendum)
Schedule colonoscopy - anti nausea medicine sent to pharmacy Perform blood workup

## 2020-03-21 NOTE — Telephone Encounter (Signed)
Mackenzie Lynch is calling stating that she had to go to the GI Dr Today and her BP was 93/61 and they advised her to contact her PCP, please advise?

## 2020-03-22 ENCOUNTER — Other Ambulatory Visit (HOSPITAL_COMMUNITY)
Admission: RE | Admit: 2020-03-22 | Discharge: 2020-03-22 | Disposition: A | Discharge status: Home / Self Care | Payer: 59 | Source: Ambulatory Visit | Attending: Adult Health | Admitting: Adult Health

## 2020-03-22 ENCOUNTER — Encounter: Payer: Self-pay | Admitting: Adult Health

## 2020-03-22 ENCOUNTER — Ambulatory Visit: Payer: 59 | Admitting: Adult Health

## 2020-03-22 VITALS — BP 94/63 | HR 78 | Ht 68.0 in | Wt 203.0 lb

## 2020-03-22 DIAGNOSIS — N812 Incomplete uterovaginal prolapse: Secondary | ICD-10-CM

## 2020-03-22 DIAGNOSIS — Z01419 Encounter for gynecological examination (general) (routine) without abnormal findings: Secondary | ICD-10-CM | POA: Diagnosis present

## 2020-03-22 LAB — CBC WITH DIFFERENTIAL/PLATELET
Absolute Monocytes: 1107 {cells}/uL — ABNORMAL HIGH (ref 200–950)
Basophils Absolute: 155 {cells}/uL (ref 0–200)
Basophils Relative: 1.3 %
Eosinophils Absolute: 631 {cells}/uL — ABNORMAL HIGH (ref 15–500)
Eosinophils Relative: 5.3 %
HCT: 44.9 % (ref 35.0–45.0)
Hemoglobin: 14.2 g/dL (ref 11.7–15.5)
Lymphs Abs: 1285 {cells}/uL (ref 850–3900)
MCH: 27.1 pg (ref 27.0–33.0)
MCHC: 31.6 g/dL — ABNORMAL LOW (ref 32.0–36.0)
MCV: 85.7 fL (ref 80.0–100.0)
MPV: 10.2 fL (ref 7.5–12.5)
Monocytes Relative: 9.3 %
Neutro Abs: 8723 {cells}/uL — ABNORMAL HIGH (ref 1500–7800)
Neutrophils Relative %: 73.3 %
Platelets: 389 Thousand/uL (ref 140–400)
RBC: 5.24 Million/uL — ABNORMAL HIGH (ref 3.80–5.10)
RDW: 12.8 % (ref 11.0–15.0)
Total Lymphocyte: 10.8 %
WBC: 11.9 Thousand/uL — ABNORMAL HIGH (ref 3.8–10.8)

## 2020-03-22 LAB — IRON,TIBC AND FERRITIN PANEL
%SAT: 17 % (ref 16–45)
Ferritin: 79 ng/mL (ref 16–232)
Iron: 57 ug/dL (ref 45–160)
TIBC: 327 ug/dL (ref 250–450)

## 2020-03-22 NOTE — Progress Notes (Signed)
Patient ID: Mackenzie Lynch, female   DOB: 15-Jun-1961, 59 y.o.   MRN: [id number] History of Present Illness: Mackenzie Lynch is a 59 year old white female,single, PM in for pelvic and pap. PCP is Mackenzie Ruths NP.   Current Medications, Allergies, Past Medical History, Past Surgical History, Family History and Social History were reviewed in [employer] record.     Review of Systems: Patient denies any headaches, hearing loss, fatigue, blurred vision, shortness of breath, chest pain, abdominal pain, problems with bowel movements(+constipation) urination, or intercourse(not having sex). No joint pain or mood swings.    Physical Exam:BP 94/63 (BP Location: Left Arm, Patient Position: Sitting, Cuff Size: Normal)   Pulse 78   Ht 5\' 8"  (1.727 m)   Wt 203 lb (92.1 kg)   LMP 04/25/2014   BMI 30.87 kg/m  General:  Well developed, well nourished, no acute distress Skin:  Warm and dry Pelvic:  External genitalia is normal in appearance, no lesions.  The vagina is pale with loss of moisture and rugae, +cystocele. Urethra has no lesions or masses. The cervix is smooth, pap with high risk HPV genotyping performed.  Uterus is felt to be normal size, shape, and contour, has uterine descensus.  No adnexal masses or tenderness noted.Bladder is non tender, no masses felt. Rectal: Good sphincter tone, no polyps, +hemorrhoids felt.  +rectocele,  Psych:  No mood changes, alert and cooperative,seems happy AA is 0 Fall risk is low PHQ 9 score is 7, no SI  Upstream - 03/22/20 1344      Pregnancy Intention Screening   Does the patient want to become pregnant in the next year? N/A    Does the patient's partner want to become pregnant in the next year? N/A    Would the patient like to discuss contraceptive options today? N/A      Contraception Wrap Up   Current Method Female Sterilization    End Method Female Sterilization    Contraception Counseling Provided No         Examination chaperoned  by Celene Squibb LPN  Impression and Plan: 1. Encounter for gynecological examination with Papanicolaou smear of cervix Pap sent Physical with PCP Pap in 3 years if normal Mammogram yearly Labs with PCP Colonoscopy in near future  2. 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

## 2020-03-23 ENCOUNTER — Telehealth (INDEPENDENT_AMBULATORY_CARE_PROVIDER_SITE_OTHER): Payer: Self-pay | Admitting: Gastroenterology

## 2020-03-23 ENCOUNTER — Encounter (INDEPENDENT_AMBULATORY_CARE_PROVIDER_SITE_OTHER): Payer: Self-pay | Admitting: *Deleted

## 2020-03-23 ENCOUNTER — Telehealth (INDEPENDENT_AMBULATORY_CARE_PROVIDER_SITE_OTHER): Payer: Self-pay | Admitting: *Deleted

## 2020-03-23 ENCOUNTER — Other Ambulatory Visit (INDEPENDENT_AMBULATORY_CARE_PROVIDER_SITE_OTHER): Payer: Self-pay | Admitting: *Deleted

## 2020-03-23 MED ORDER — SUPREP BOWEL PREP KIT 17.5-3.13-1.6 GM/177ML PO SOLN: 1.0000 | Freq: Once | ORAL | 0 refills | Status: AC

## 2020-03-23 NOTE — Telephone Encounter (Signed)
Patient needs suprep 

## 2020-03-23 NOTE — Telephone Encounter (Signed)
Called patient to discuss results of lab tests which showed norma H/H and iron stores, no need for EGD.  Patient understood and agreed.   Harvel Quale, MD Gastroenterology and Hepatology [facility]

## 2020-03-24 LAB — CYTOLOGY - PAP
Comment: NEGATIVE
Diagnosis: NEGATIVE
High risk HPV: NEGATIVE

## 2020-04-19 ENCOUNTER — Encounter (INDEPENDENT_AMBULATORY_CARE_PROVIDER_SITE_OTHER): Payer: Self-pay | Admitting: Nurse Practitioner

## 2020-04-26 ENCOUNTER — Other Ambulatory Visit: Payer: Self-pay

## 2020-04-26 ENCOUNTER — Encounter: Payer: Self-pay | Admitting: Cardiology

## 2020-04-26 ENCOUNTER — Ambulatory Visit: Payer: 59 | Admitting: Cardiology

## 2020-04-26 VITALS — BP 106/58 | HR 76 | Ht 68.0 in | Wt 204.0 lb

## 2020-04-26 DIAGNOSIS — R9431 Abnormal electrocardiogram [ECG] [EKG]: Secondary | ICD-10-CM | POA: Diagnosis not present

## 2020-04-26 DIAGNOSIS — Z23 Encounter for immunization: Secondary | ICD-10-CM | POA: Diagnosis not present

## 2020-04-26 NOTE — Progress Notes (Signed)
Clinical Summary Mackenzie Lynch is a 59 y.o.female seen today as a new consult, referred by NP Pearline Cables for the following medical problems.   1. Abnormal EKG - nonspecific ST/T changes noted by pcp on EKG - no cardiopulmonary symptoms. Denies any chest pain, no SOB or DOE - prior dizziness and fatigue that resolved with lowering her lisinopril dose.      Past Medical History:  Diagnosis Date   Hyperlipidemia    Hypertension    Kidney stones      Allergies  Allergen Reactions   Sulfur Nausea Only   Sulfa Antibiotics      Current Outpatient Medications  Medication Sig Dispense Refill   etodolac (LODINE) 400 MG tablet Take 400 mg by mouth 2 (two) times daily.     lisinopril (ZESTRIL) 5 MG tablet Take 1 tablet (5 mg total) by mouth daily. 90 tablet 0   promethazine (PHENERGAN) 25 MG tablet Take 1 tablet (25 mg total) by mouth every 6 (six) hours as needed for up to 1 dose for nausea or vomiting (take 30 minutes before starting the prep). 1 tablet 0   Sennosides-Docusate Sodium (SENNA S PO) Take by mouth at bedtime.     No current facility-administered medications for this visit.     Past Surgical History:  Procedure Laterality Date   CHOLECYSTECTOMY     LITHOTRIPSY     TUBAL LIGATION       Allergies  Allergen Reactions   Sulfur Nausea Only   Sulfa Antibiotics       Family History  Problem Relation Age of Onset   Heart failure Mother    Diabetes Father    Hypertension Father      Social History Mackenzie Lynch reports that she has been smoking cigarettes. She has been smoking about 1.00 pack per day. She has never used smokeless tobacco. Mackenzie Lynch reports no history of alcohol use.   Review of Systems CONSTITUTIONAL: No weight loss, fever, chills, weakness or fatigue.  HEENT: Eyes: No visual loss, blurred vision, double vision or yellow sclerae.No hearing loss, sneezing, congestion, runny nose or sore throat.  SKIN: No rash or itching.    CARDIOVASCULAR: per hpi RESPIRATORY: No shortness of breath, cough or sputum.  GASTROINTESTINAL: No anorexia, nausea, vomiting or diarrhea. No abdominal pain or blood.  GENITOURINARY: No burning on urination, no polyuria NEUROLOGICAL: No headache, dizziness, syncope, paralysis, ataxia, numbness or tingling in the extremities. No change in bowel or bladder control.  MUSCULOSKELETAL: No muscle, back pain, joint pain or stiffness.  LYMPHATICS: No enlarged nodes. No history of splenectomy.  PSYCHIATRIC: No history of depression or anxiety.  ENDOCRINOLOGIC: No reports of sweating, cold or heat intolerance. No polyuria or polydipsia.  Marland Kitchen   Physical Examination Today's Vitals   04/26/20 1401  BP: (!) 106/58  Pulse: 76  SpO2: 99%  Weight: 204 lb (92.5 kg)  Height: 5\' 8"  (1.727 m)   Body mass index is 31.02 kg/m.  Gen: resting comfortably, no acute distress HEENT: no scleral icterus, pupils equal round and reactive, no palptable cervical adenopathy,  CV: RRR, no m/r/g, no jvd Resp: Clear to auscultation bilaterally GI: abdomen is soft, non-tender, non-distended, normal bowel sounds, no hepatosplenomegaly MSK: extremities are warm, no edema.  Skin: warm, no rash Neuro:  no focal deficits Psych: appropriate affect     Assessment and Plan  1. Abnormal EKG - EKG reviewed from pcp and repeated today, sinus rhythm with nonspecific ST/T changes - no cardiopulmonary  symptoms - prior dizzienss and fatigue has resolved with lower lisinopril dose - no further cardiac testing is indidcated at this time.      Arnoldo Lenis, M.D.

## 2020-04-26 NOTE — Patient Instructions (Signed)
Medication Instructions:  Your physician recommends that you continue on your current medications as directed. Please refer to the Current Medication list given to you today.     Follow-Up: At [facility], you and your health needs are our priority.  As part of our continuing mission to provide you with exceptional heart care, we have created designated Provider Care Teams.  These Care Teams include your primary Cardiologist (physician) and Advanced Practice Providers (APPs -  Physician Assistants and Nurse Practitioners) who all work together to provide you with the care you need, when you need it.  We recommend signing up for the patient portal called "MyChart".  Sign up information is provided on this After Visit Summary.  MyChart is used to connect with patients for Virtual Visits (Telemedicine).  Patients are able to view lab/test results, encounter notes, upcoming appointments, etc.  Non-urgent messages can be sent to your provider as well.   To learn more about what you can do with MyChart, go to [email or web address].    Your next appointment:  As needed with Dr.Branch     Thank you for choosing [facility] !

## 2020-05-09 ENCOUNTER — Other Ambulatory Visit: Payer: Self-pay

## 2020-05-09 ENCOUNTER — Other Ambulatory Visit (HOSPITAL_COMMUNITY)
Admission: RE | Admit: 2020-05-09 | Discharge: 2020-05-09 | Disposition: A | Discharge status: Home / Self Care | Payer: 59 | Source: Ambulatory Visit | Attending: Gastroenterology | Admitting: Gastroenterology

## 2020-05-09 DIAGNOSIS — Z20822 Contact with and (suspected) exposure to covid-19: Secondary | ICD-10-CM | POA: Insufficient documentation

## 2020-05-09 DIAGNOSIS — Z01812 Encounter for preprocedural laboratory examination: Secondary | ICD-10-CM | POA: Insufficient documentation

## 2020-05-10 LAB — SARS CORONAVIRUS 2 (TAT 6-24 HRS): SARS Coronavirus 2: NEGATIVE

## 2020-05-11 ENCOUNTER — Encounter (HOSPITAL_COMMUNITY)
Admission: RE | Disposition: A | Discharge status: Home / Self Care | Payer: Self-pay | Source: Home / Self Care | Attending: Gastroenterology

## 2020-05-11 ENCOUNTER — Ambulatory Visit (HOSPITAL_COMMUNITY)
Admission: RE | Admit: 2020-05-11 | Discharge: 2020-05-11 | Disposition: A | Discharge status: Home / Self Care | Payer: 59 | Attending: Gastroenterology | Admitting: Gastroenterology

## 2020-05-11 ENCOUNTER — Ambulatory Visit (HOSPITAL_COMMUNITY): Payer: 59 | Admitting: Anesthesiology

## 2020-05-11 ENCOUNTER — Other Ambulatory Visit: Payer: Self-pay

## 2020-05-11 ENCOUNTER — Encounter (HOSPITAL_COMMUNITY): Payer: Self-pay | Admitting: Gastroenterology

## 2020-05-11 DIAGNOSIS — K625 Hemorrhage of anus and rectum: Secondary | ICD-10-CM | POA: Insufficient documentation

## 2020-05-11 DIAGNOSIS — Z9049 Acquired absence of other specified parts of digestive tract: Secondary | ICD-10-CM | POA: Diagnosis not present

## 2020-05-11 DIAGNOSIS — D123 Benign neoplasm of transverse colon: Secondary | ICD-10-CM

## 2020-05-11 DIAGNOSIS — K5909 Other constipation: Secondary | ICD-10-CM | POA: Diagnosis not present

## 2020-05-11 DIAGNOSIS — R195 Other fecal abnormalities: Secondary | ICD-10-CM | POA: Diagnosis not present

## 2020-05-11 DIAGNOSIS — K648 Other hemorrhoids: Secondary | ICD-10-CM | POA: Diagnosis not present

## 2020-05-11 DIAGNOSIS — D122 Benign neoplasm of ascending colon: Secondary | ICD-10-CM | POA: Insufficient documentation

## 2020-05-11 DIAGNOSIS — Z79899 Other long term (current) drug therapy: Secondary | ICD-10-CM | POA: Insufficient documentation

## 2020-05-11 DIAGNOSIS — I10 Essential (primary) hypertension: Secondary | ICD-10-CM | POA: Diagnosis not present

## 2020-05-11 DIAGNOSIS — Z882 Allergy status to sulfonamides status: Secondary | ICD-10-CM | POA: Insufficient documentation

## 2020-05-11 DIAGNOSIS — Z8249 Family history of ischemic heart disease and other diseases of the circulatory system: Secondary | ICD-10-CM | POA: Insufficient documentation

## 2020-05-11 DIAGNOSIS — F17210 Nicotine dependence, cigarettes, uncomplicated: Secondary | ICD-10-CM | POA: Diagnosis not present

## 2020-05-11 HISTORY — PX: POLYPECTOMY: SHX5525

## 2020-05-11 HISTORY — DX: Personal history of urinary calculi: Z87.442

## 2020-05-11 HISTORY — PX: SUBMUCOSAL TATTOO INJECTION: SHX6856

## 2020-05-11 HISTORY — PX: BIOPSY: SHX5522

## 2020-05-11 HISTORY — PX: COLONOSCOPY WITH PROPOFOL: SHX5780

## 2020-05-11 LAB — HM COLONOSCOPY

## 2020-05-11 SURGERY — COLONOSCOPY WITH PROPOFOL
Anesthesia: General

## 2020-05-11 MED ORDER — SPOT INK MARKER SYRINGE KIT
PACK | SUBMUCOSAL | Status: AC
  Filled 2020-05-11: qty 5

## 2020-05-11 MED ORDER — SPOT INK MARKER SYRINGE KIT
PACK | SUBMUCOSAL | Status: DC | PRN
  Administered 2020-05-11: 3 mL via SUBMUCOSAL

## 2020-05-11 MED ORDER — PROPOFOL 10 MG/ML IV BOLUS
INTRAVENOUS | Status: AC
  Filled 2020-05-11: qty 60

## 2020-05-11 MED ORDER — EPHEDRINE 5 MG/ML INJ
INTRAVENOUS | Status: AC
  Filled 2020-05-11: qty 10

## 2020-05-11 MED ORDER — LACTATED RINGERS IV SOLN
INTRAVENOUS | Status: DC
  Administered 2020-05-11: 1000 mL via INTRAVENOUS

## 2020-05-11 MED ORDER — SODIUM CHLORIDE FLUSH 0.9 % IV SOLN
INTRAVENOUS | Status: AC
  Filled 2020-05-11: qty 10

## 2020-05-11 MED ORDER — PROPOFOL 500 MG/50ML IV EMUL
INTRAVENOUS | Status: DC | PRN
  Administered 2020-05-11: 150 ug/kg/min via INTRAVENOUS

## 2020-05-11 MED ORDER — EPHEDRINE SULFATE 50 MG/ML IJ SOLN
INTRAMUSCULAR | Status: DC | PRN
  Administered 2020-05-11: 10 mg via INTRAVENOUS

## 2020-05-11 MED ORDER — PROPOFOL 10 MG/ML IV BOLUS
INTRAVENOUS | Status: AC
  Filled 2020-05-11: qty 120

## 2020-05-11 MED ORDER — PROPOFOL 10 MG/ML IV BOLUS
INTRAVENOUS | Status: DC | PRN
  Administered 2020-05-11 (×4): 20 mg via INTRAVENOUS
  Administered 2020-05-11: 40 mg via INTRAVENOUS
  Administered 2020-05-11: 20 mg via INTRAVENOUS
  Administered 2020-05-11: 40 mg via INTRAVENOUS
  Administered 2020-05-11 (×5): 20 mg via INTRAVENOUS

## 2020-05-11 NOTE — Transfer of Care (Signed)
Immediate Anesthesia Transfer of Care Note  Patient: Mackenzie Lynch  Procedure(s) Performed: COLONOSCOPY WITH PROPOFOL (N/A ) BIOPSY POLYPECTOMY SUBMUCOSAL TATTOO INJECTION  Patient Location: PACU  Anesthesia Type:General  Level of Consciousness: awake  Airway & Oxygen Therapy: Patient Spontanous Breathing  Post-op Assessment: Report given to RN  Post vital signs: Reviewed and stable  Last Vitals:  Vitals Value Taken Time  BP    Temp    Pulse    Resp    SpO2      Last Pain:  Vitals:   05/11/20 0743  PainSc: 0-No pain         Complications: No complications documented.

## 2020-05-11 NOTE — Discharge Instructions (Signed)
You are being discharged to home.  Resume your previous diet.  We are waiting for your pathology results.  Will refer to colorectal surgeon for resection. Your physician has recommended a repeat colonoscopy (date to be determined after pending pathology results are reviewed and evaluation by colorectal surgeon ) for surveillance.        Colonoscopy, Adult, Care After This sheet gives you information about how to care for yourself after your procedure. Your doctor may also give you more specific instructions. If you have problems or questions, call your doctor. What can I expect after the procedure? After the procedure, it is common to have:  A small amount of blood in your poop (stool) for 24 hours.  Some gas.  Mild cramping or bloating in your belly (abdomen). Follow these instructions at home: Eating and drinking   Drink enough fluid to keep your pee (urine) pale yellow.  Follow instructions from your doctor about what you cannot eat or drink.  Return to your normal diet as told by your doctor. Avoid heavy or fried foods that are hard to digest. Activity  Rest as told by your doctor.  Do not sit for a long time without moving. Get up to take short walks every 1-2 hours. This is important. Ask for help if you feel weak or unsteady.  Return to your normal activities as told by your doctor. Ask your doctor what activities are safe for you. To help cramping and bloating:   Try walking around.  Put heat on your belly as told by your doctor. Use the heat source that your doctor recommends, such as a moist heat pack or a heating pad. ? Put a towel between your skin and the heat source. ? Leave the heat on for 20-30 minutes. ? Remove the heat if your skin turns bright red. This is very important if you are unable to feel pain, heat, or cold. You may have a greater risk of getting burned. General instructions  For the first 24 hours after the procedure: ? Do not drive or use  machinery. ? Do not sign important documents. ? Do not drink alcohol. ? Do your daily activities more slowly than normal. ? Eat foods that are soft and easy to digest.  Take over-the-counter or prescription medicines only as told by your doctor.  Keep all follow-up visits as told by your doctor. This is important. Contact a doctor if:  You have blood in your poop 2-3 days after the procedure. Get help right away if:  You have more than a small amount of blood in your poop.  You see large clumps of tissue (blood clots) in your poop.  Your belly is swollen.  You feel like you may vomit (nauseous).  You vomit.  You have a fever.  You have belly pain that gets worse, and medicine does not help your pain. Summary  After the procedure, it is common to have a small amount of blood in your poop. You may also have mild cramping and bloating in your belly.  For the first 24 hours after the procedure, do not drive or use machinery, do not sign important documents, and do not drink alcohol.  Get help right away if you have a lot of blood in your poop, feel like you may vomit, have a fever, or have more belly pain. This information is not intended to replace advice given to you by your health care provider. Make sure you discuss any questions you  have with your health care provider. Document Revised: 02/16/2019 Document Reviewed: 02/16/2019 Elsevier Patient Education  [place].      Colon Polyps  Polyps are tissue growths inside the body. Polyps can grow in many places, including the large intestine (colon). A polyp may be a round bump or a mushroom-shaped growth. You could have one polyp or several. Most colon polyps are noncancerous (benign). However, some colon polyps can become cancerous over time. Finding and removing the polyps early can help prevent this. What are the causes? The exact cause of colon polyps is not known. What increases the risk? You are more  likely to develop this condition if you:  Have a family history of colon cancer or colon polyps.  Are older than 4 or older than 45 if you are African American.  Have inflammatory bowel disease, such as ulcerative colitis or Crohn's disease.  Have certain hereditary conditions, such as: ? Familial adenomatous polyposis. ? Lynch syndrome. ? Turcot syndrome. ? Peutz-Jeghers syndrome.  Are overweight.  Smoke cigarettes.  Do not get enough exercise.  Drink too much alcohol.  Eat a diet that is high in fat and red meat and low in fiber.  Had childhood cancer that was treated with abdominal radiation. What are the signs or symptoms? Most polyps do not cause symptoms. If you have symptoms, they may include:  Blood coming from your rectum when having a bowel movement.  Blood in your stool. The stool may look dark red or black.  Abdominal pain.  A change in bowel habits, such as constipation or diarrhea. How is this diagnosed? This condition is diagnosed with a colonoscopy. This is a procedure in which a lighted, flexible scope is inserted into the anus and then passed into the colon to examine the area. Polyps are sometimes found when a colonoscopy is done as part of routine cancer screening tests. How is this treated? Treatment for this condition involves removing any polyps that are found. Most polyps can be removed during a colonoscopy. Those polyps will then be tested for cancer. Additional treatment may be needed depending on the results of testing. Follow these instructions at home: Lifestyle  Maintain a healthy weight, or lose weight if recommended by your health care provider.  Exercise every day or as told by your health care provider.  Do not use any products that contain nicotine or tobacco, such as cigarettes and e-cigarettes. If you need help quitting, ask your health care provider.  If you drink alcohol, limit how much you have: ? 0-1 drink a day for  women. ? 0-2 drinks a day for men.  Be aware of how much alcohol is in your drink. In the [place] one drink equals one 12 oz bottle of beer (355 mL), one 5 oz glass of wine (148 mL), or one 1 oz shot of hard liquor (44 mL). Eating and drinking   Eat foods that are high in fiber, such as fruits, vegetables, and whole grains.  Eat foods that are high in calcium and vitamin D, such as milk, cheese, yogurt, eggs, liver, fish, and broccoli.  Limit foods that are high in fat, such as fried foods and desserts.  Limit the amount of red meat and processed meat you eat, such as hot dogs, sausage, bacon, and lunch meats. General instructions  Keep all follow-up visits as told by your health care provider. This is important. ? This includes having regularly scheduled colonoscopies. ? Talk to your health care provider  about when you need a colonoscopy. Contact a health care provider if:  You have new or worsening bleeding during a bowel movement.  You have new or increased blood in your stool.  You have a change in bowel habits.  You lose weight for no known reason. Summary  Polyps are tissue growths inside the body. Polyps can grow in many places, including the colon.  Most colon polyps are noncancerous (benign), but some can become cancerous over time.  This condition is diagnosed with a colonoscopy.  Treatment for this condition involves removing any polyps that are found. Most polyps can be removed during a colonoscopy. This information is not intended to replace advice given to you by your health care provider. Make sure you discuss any questions you have with your health care provider. Document Revised: 11/07/2017 Document Reviewed: 11/07/2017 Elsevier Patient Education  [place] After These instructions provide you with information about caring for yourself after your procedure. Your health care provider may also give you more  specific instructions. Your treatment has been planned according to current medical practices, but problems sometimes occur. Call your health care provider if you have any problems or questions after your procedure. What can I expect after the procedure? After your procedure, you may:  Feel sleepy for several hours.  Feel clumsy and have poor balance for several hours.  Feel forgetful about what happened after the procedure.  Have poor judgment for several hours.  Feel nauseous or vomit.  Have a sore throat if you had a breathing tube during the procedure. Follow these instructions at home: For at least 24 hours after the procedure:      Have a responsible adult stay with you. It is important to have someone help care for you until you are awake and alert.  Rest as needed.  Do not: ? Participate in activities in which you could fall or become injured. ? Drive. ? Use heavy machinery. ? Drink alcohol. ? Take sleeping pills or medicines that cause drowsiness. ? Make important decisions or sign legal documents. ? Take care of children on your own. Eating and drinking  Follow the diet that is recommended by your health care provider.  If you vomit, drink water, juice, or soup when you can drink without vomiting.  Make sure you have little or no nausea before eating solid foods. General instructions  Take over-the-counter and prescription medicines only as told by your health care provider.  If you have sleep apnea, surgery and certain medicines can increase your risk for breathing problems. Follow instructions from your health care provider about wearing your sleep device: ? Anytime you are sleeping, including during daytime naps. ? While taking prescription pain medicines, sleeping medicines, or medicines that make you drowsy.  If you smoke, do not smoke without supervision.  Keep all follow-up visits as told by your health care provider. This is important. Contact a  health care provider if:  You keep feeling nauseous or you keep vomiting.  You feel light-headed.  You develop a rash.  You have a fever. Get help right away if:  You have trouble breathing. Summary  For several hours after your procedure, you may feel sleepy and have poor judgment.  Have a responsible adult stay with you for at least 24 hours or until you are awake and alert. This information is not intended to replace advice given to you by your health care provider. Make sure  you discuss any questions you have with your health care provider. Document Revised: 10/21/2017 Document Reviewed: 11/13/2015 Elsevier Patient Education  [place].

## 2020-05-11 NOTE — Anesthesia Postprocedure Evaluation (Signed)
Anesthesia Post Note  Patient: Mackenzie Lynch  Procedure(s) Performed: COLONOSCOPY WITH PROPOFOL (N/A ) BIOPSY POLYPECTOMY SUBMUCOSAL TATTOO INJECTION  Patient location during evaluation: Endoscopy Anesthesia Type: General Level of consciousness: awake and alert Pain management: pain level controlled Vital Signs Assessment: post-procedure vital signs reviewed and stable Respiratory status: spontaneous breathing Cardiovascular status: blood pressure returned to baseline and stable Postop Assessment: no apparent nausea or vomiting Anesthetic complications: no   No complications documented.   Last Vitals:  Vitals:   05/11/20 0656  BP: 113/72  Resp: 20  Temp: 36.8 C  SpO2: 97%    Last Pain:  Vitals:   05/11/20 0743  PainSc: 0-No pain                 Masaichi Kracht

## 2020-05-11 NOTE — Anesthesia Preprocedure Evaluation (Signed)
Anesthesia Evaluation  Patient identified by MRN, date of birth, ID band Patient awake    Reviewed: Allergy & Precautions, H&P , NPO status , Patient's Chart, lab work & pertinent test results, reviewed documented beta blocker date and time   Airway Mallampati: II  TM Distance: >3 FB Neck ROM: full    Dental no notable dental hx.    Pulmonary neg pulmonary ROS, Current Smoker,    Pulmonary exam normal breath sounds clear to auscultation       Cardiovascular Exercise Tolerance: Good hypertension, negative cardio ROS   Rhythm:regular Rate:Normal     Neuro/Psych negative neurological ROS  negative psych ROS   GI/Hepatic negative GI ROS, Neg liver ROS,   Endo/Other  negative endocrine ROS  Renal/GU negative Renal ROS  negative genitourinary   Musculoskeletal   Abdominal   Peds  Hematology negative hematology ROS (+)   Anesthesia Other Findings   Reproductive/Obstetrics negative OB ROS                             Anesthesia Physical Anesthesia Plan  ASA: II  Anesthesia Plan: General   Post-op Pain Management:    Induction:   PONV Risk Score and Plan: Propofol infusion  Airway Management Planned:   Additional Equipment:   Intra-op Plan:   Post-operative Plan:   Informed Consent: I have reviewed the patients History and Physical, chart, labs and discussed the procedure including the risks, benefits and alternatives for the proposed anesthesia with the patient or authorized representative who has indicated his/her understanding and acceptance.     Dental Advisory Given  Plan Discussed with: CRNA  Anesthesia Plan Comments:         Anesthesia Quick Evaluation  

## 2020-05-11 NOTE — Op Note (Addendum)
[facility] Patient Name: Mackenzie Lynch Procedure Date: 05/11/2020 7:07 AM MRN: [id number] Date of Birth: 1961-07-06 Attending MD: [name] ,  CSN: [id number] Age: 59 Admit Type: Outpatient Procedure:                Colonoscopy Indications:              Rectal bleeding, Positive fecal immunochemical test Providers:                [name], Jeanann Lewandowsky. Sharon Seller, RN, Dereck Leep, Technician, Randa Spike,                            Technician Referring MD:              Medicines:                Monitored Anesthesia Care Complications:            No immediate complications. Estimated Blood Loss:     Estimated blood loss: none. Procedure:                Pre-Anesthesia Assessment:                           - Prior to the procedure, a History and Physical                            was performed, and patient medications, allergies                            and sensitivities were reviewed. The patient's                            tolerance of previous anesthesia was reviewed.                           - The risks and benefits of the procedure and the                            sedation options and risks were discussed with the                            patient. All questions were answered and informed                            consent was obtained.                           - ASA Grade Assessment: II - A patient with mild                            systemic disease.                           After obtaining informed consent, the colonoscope  was passed under direct vision. Throughout the                            procedure, the patient's blood pressure, pulse, and                            oxygen saturations were monitored continuously. The                            [id number]-[id number] ([id number]) scope was introduced through                            the anus and advanced to the the cecum, identified                             by the ileocecal valve. The colonoscopy was                            technically difficult and complex due to                            significant looping. Successful completion of the                            procedure was aided by applying abdominal pressure.                            The patient tolerated the procedure well. The                            quality of the bowel preparation was good. Scope In: 7:44:09 AM Scope Out: 9:44:42 AM Scope Withdrawal Time: 1 hour 5 minutes 22 seconds  Total Procedure Duration: 2 hours 0 minutes 33 seconds  Findings:      The perianal and digital rectal examinations were normal.      The proximal ascending colon revealed significantly excessive looping.      A 15 mm polyp was found in the ascending colon, with an area of central       depression. The polyp was multi-lobulated. Biopsies were taken with a       cold forceps for histology. Area was tattooed with an injection of 1 mL       of [place] ink.      A 18 mm polyp was found in the ascending colon with couple of areas of       central depression. The polyp was multi-lobulated. Biopsies were taken       with a cold forceps for histology. Area was tattooed with an injection       of 2 mL of [place] ink.      Five multi-lobulated clusters of polyps were found in the ascending       colon. The polyps were 3 to 8 mm in size. These polyps were removed with       a combination of cold snare and cold biopsy forceps. Resection and       retrieval were complete.      Two multi-lobulated clusters  polyps were found in the transverse colon.       The polyps were 4 to 6 mm in size. These polyps were removed with a cold       snare and cold biopsy forceps. Resection and retrieval were complete.      A 8 mm polyp was found in the transverse colon. The polyp was sessile.       The polyp was removed with a cold snare. Resection and retrieval were       complete.      Non-bleeding internal  hemorrhoids were found during retroflexion. The       hemorrhoids were medium-sized. Impression:               - There was significant looping of the colon.                           - One 15 mm polyp in the ascending colon. Biopsied.                            Tattooed.                           - One 18 mm polyp in the ascending colon. Biopsied.                            Tattooed.                           - Five 3 to 8 mm polyps in the ascending colon,                            removed with a cold snare. Resected and retrieved.                           - Two 4 to 6 mm polyps in the transverse colon,                            removed with a cold snare. Resected and retrieved.                           - One 8 mm polyp in the transverse colon, removed                            with a cold snare. Resected and retrieved.                           - Non-bleeding internal hemorrhoids. Moderate Sedation:      Per Anesthesia Care Recommendation:           - Discharge patient to home (ambulatory).                           - Resume previous diet.                           - Await pathology results.                           -  Refer to a colo-rectal surgeon.                           - Repeat colonoscopy date to be determined after                            pending pathology results are reviewed and                            evaluation by colorectal surgeon for surveillance. Procedure Code(s):        --- Professional ---                           [phone], GC, Colonoscopy, flexible; with removal of                            tumor(s), polyp(s), or other lesion(s) by snare                            technique                           45381, Colonoscopy, flexible; with directed                            submucosal injection(s), any substance                           93716, 59, Colonoscopy, flexible; with biopsy,                            single or multiple Diagnosis Code(s):        ---  Professional ---                           K64.8, Other hemorrhoids                           K63.5, Polyp of colon                           K62.5, Hemorrhage of anus and rectum                           R19.5, Other fecal abnormalities CPT copyright 2019 American Medical Association. All rights reserved. The codes documented in this report are preliminary and upon coder review may  be revised to meet current compliance requirements. [name], MD [name],  05/11/2020 9:58:48 AM This report has been signed electronically. Number of Addenda: 0

## 2020-05-11 NOTE — H&P (Signed)
Mackenzie Lynch is an 59 y.o. female.   Chief Complaint: Positive FIT and rectal bleeding HPI: 59 y.o. female with past medical history of hyperlipidemia and hypertension who comes to the hospital for evaluation of positive FIT and episodes of rectal bleeding.  The patient had a positive FIT on 03/08/2020.  Last colonoscopy was performed 10 years ago and was found to have hemorrhoids without any other alteration.  No family history of colorectal cancer.  The patient endorses sinus chronic history of constipation which has been improving with the use of senna.  Previously she presented episodes of rectal bleeding that have improved with an increase in her bowel movements.  The patient denies having any nausea, vomiting, fever, chills,  melena, hematemesis, abdominal distention, abdominal pain, diarrhea, jaundice, pruritus or weight loss.  Past Medical History:  Diagnosis Date  . History of kidney stones   . Hyperlipidemia   . Hypertension     Past Surgical History:  Procedure Laterality Date  . CHOLECYSTECTOMY    . LITHOTRIPSY    . TUBAL LIGATION      Family History  Problem Relation Age of Onset  . Heart failure Mother   . Diabetes Father   . Hypertension Father    Social History:  reports that she has been smoking cigarettes. She has been smoking about 1.00 pack per day. She has never used smokeless tobacco. She reports that she does not drink alcohol and does not use drugs.  Allergies:  Allergies  Allergen Reactions  . Sulfa Antibiotics Nausea Only    Medications Prior to Admission  Medication Sig Dispense Refill  . etodolac (LODINE) 400 MG tablet Take 400 mg by mouth 2 (two) times daily.    Marland Kitchen lisinopril (ZESTRIL) 5 MG tablet Take 1 tablet (5 mg total) by mouth daily. 90 tablet 0  . promethazine (PHENERGAN) 25 MG tablet Take 1 tablet (25 mg total) by mouth every 6 (six) hours as needed for up to 1 dose for nausea or vomiting (take 30 minutes before starting the prep). (Patient  taking differently: Take 25 mg by mouth every 6 (six) hours as needed for nausea or vomiting (take 30 minutes before starting the prep). Nausea/vomiting associated with bowel prep kit) 1 tablet 0  . sennosides-docusate sodium (SENOKOT-S) 8.6-50 MG tablet Take 2 tablets by mouth at bedtime.    Manus Gunning BOWEL PREP KIT 17.5-3.13-1.6 GM/177ML SOLN Take 354 mLs by mouth as directed.      Results for orders placed or performed during the hospital encounter of 05/09/20 (from the past 48 hour(s))  SARS CORONAVIRUS 2 (TAT 6-24 HRS) Nasopharyngeal Nasopharyngeal Swab     Status: None   Collection Time: 05/09/20  3:45 PM   Specimen: Nasopharyngeal Swab  Result Value Ref Range   SARS Coronavirus 2 NEGATIVE NEGATIVE    Comment: (NOTE) SARS-CoV-2 target nucleic acids are NOT DETECTED.  The SARS-CoV-2 RNA is generally detectable in upper and lower respiratory specimens during the acute phase of infection. Negative results do not preclude SARS-CoV-2 infection, do not rule out co-infections with other pathogens, and should not be used as the sole basis for treatment or other patient management decisions. Negative results must be combined with clinical observations, patient history, and epidemiological information. The expected result is Negative.  Fact Sheet for Patients: SugarRoll.be  Fact Sheet for Healthcare Providers: https://www.woods-mathews.com/  This test is not yet approved or cleared by the [place] FDA and  has been authorized for detection and/or diagnosis of SARS-CoV-2  by FDA under an Emergency Use Authorization (EUA). This EUA will remain  in effect (meaning this test can be used) for the duration of the COVID-19 declaration under Se ction 564(b)(1) of the Act, 21 U.S.C. section 360bbb-3(b)(1), unless the authorization is terminated or revoked sooner.  Performed at [facility], [facility] [address], [place], [place] [address]    No  results found.  Review of Systems  Constitutional: Negative.   HENT: Negative.   Eyes: Negative.   Respiratory: Negative.   Cardiovascular: Negative.   Gastrointestinal: Negative.   Endocrine: Negative.   Genitourinary: Negative.   Musculoskeletal: Negative.   Skin: Negative.   Allergic/Immunologic: Negative.   Neurological: Negative.   Hematological: Negative.   Psychiatric/Behavioral: Negative.     Blood pressure 113/72, temperature 98.3 F (36.8 C), resp. rate 20, height $RemoveBe'5\' 8"'COdiZnnoc$  (1.727 m), weight 90.7 kg, last menstrual period 04/25/2014, SpO2 97 %. Physical Exam  GENERAL: The patient is AO x3, in no acute distress. HEENT: Head is normocephalic and atraumatic. EOMI are intact. Mouth is well hydrated and without lesions. NECK: Supple. No masses LUNGS: Clear to auscultation. No presence of rhonchi/wheezing/rales. Adequate chest expansion HEART: RRR, normal s1 and s2. ABDOMEN: Soft, nontender, no guarding, no peritoneal signs, and nondistended. BS +. No masses. EXTREMITIES: Without any cyanosis, clubbing, rash, lesions or edema. NEUROLOGIC: AOx3, no focal motor deficit. SKIN: no jaundice, no rashes  Assessment/Plan 59 y.o. female with past medical history of hyperlipidemia and hypertension who comes to the hospital for evaluation of positive FIT and episodes of rectal bleeding.  We will proceed with colonoscopy today.  Harvel Quale, MD 05/11/2020, 7:37 AM

## 2020-05-12 LAB — SURGICAL PATHOLOGY

## 2020-05-17 ENCOUNTER — Encounter (HOSPITAL_COMMUNITY): Payer: Self-pay | Admitting: Gastroenterology

## 2020-05-24 ENCOUNTER — Other Ambulatory Visit: Payer: Self-pay

## 2020-05-24 ENCOUNTER — Encounter (INDEPENDENT_AMBULATORY_CARE_PROVIDER_SITE_OTHER): Payer: Self-pay | Admitting: Internal Medicine

## 2020-05-24 ENCOUNTER — Ambulatory Visit (INDEPENDENT_AMBULATORY_CARE_PROVIDER_SITE_OTHER): Payer: 59 | Admitting: Internal Medicine

## 2020-05-24 VITALS — BP 118/70 | HR 77 | Temp 98.1°F | Ht 68.0 in | Wt 211.6 lb

## 2020-05-24 DIAGNOSIS — I10 Essential (primary) hypertension: Secondary | ICD-10-CM

## 2020-05-24 DIAGNOSIS — K5900 Constipation, unspecified: Secondary | ICD-10-CM | POA: Diagnosis not present

## 2020-05-24 DIAGNOSIS — E669 Obesity, unspecified: Secondary | ICD-10-CM

## 2020-05-24 MED ORDER — LISINOPRIL 5 MG PO TABS: 5.0000 mg | ORAL_TABLET | Freq: Every day | ORAL | 0 refills | Status: DC

## 2020-05-24 NOTE — Progress Notes (Signed)
Metrics: Intervention Frequency ACO  Documented Smoking Status Yearly  Screened one or more times in 24 months  Cessation Counseling or  Active cessation medication Past 24 months  Past 24 months   Guideline developer: UpToDate (See UpToDate for funding source) Date Released: 2014       Wellness Office Visit  Subjective:  Patient ID: Mackenzie Lynch, female    DOB: [birth year]  Age: 59 y.o. MRN: [id number]  CC: This lady comes in for follow-up of hypertension, constipation, obesity. HPI  She is having significant back pain and hip pain and she is seeing orthopedics in [place] for this. She recently had colonoscopy which showed several benign polyps and she is going to have a repeat colonoscopy in several weeks time. She continues on lisinopril for her hypertension. Senokot-S seems to have helped her constipation significantly.  She does have trouble drinking plenty of water and eating fiber rich foods. Past Medical History:  Diagnosis Date  . History of kidney stones   . Hyperlipidemia   . Hypertension    Past Surgical History:  Procedure Laterality Date  . BIOPSY  05/11/2020   Procedure: BIOPSY;  Surgeon: Harvel Quale, MD;  [facility];  Service: Gastroenterology;;  polyps  . CHOLECYSTECTOMY    . COLONOSCOPY WITH PROPOFOL N/A 05/11/2020   Procedure: COLONOSCOPY WITH PROPOFOL;  Surgeon: Harvel Quale, MD;  Location: [facility];  Service: Gastroenterology;  Laterality: N/A;  730  . LITHOTRIPSY    . POLYPECTOMY  05/11/2020   Procedure: POLYPECTOMY;  Surgeon: Harvel Quale, MD;  [facility];  Service: Gastroenterology;;  . Lia Foyer TATTOO INJECTION  05/11/2020   Procedure: SUBMUCOSAL TATTOO INJECTION;  Surgeon: Harvel Quale, MD;  [facility];  Service: Gastroenterology;;  colon  . TUBAL LIGATION       Family History  Problem Relation Age of Onset  . Heart failure Mother   . Diabetes  Father   . Hypertension Father   . Diabetes Sister   . 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   Social History   Tobacco Use  . Smoking status: Current Every Day Smoker    Packs/day: 1.00    Types: Cigarettes  . Smokeless tobacco: Never Used  Substance Use Topics  . Alcohol use: No    Current Meds  Medication Sig  . etodolac (LODINE) 400 MG tablet Take 400 mg by mouth 2 (two) times daily.  Marland Kitchen lisinopril (ZESTRIL) 5 MG tablet Take 1 tablet (5 mg total) by mouth daily.  . promethazine (PHENERGAN) 25 MG tablet Take 1 tablet (25 mg total) by mouth every 6 (six) hours as needed for up to 1 dose for nausea or vomiting (take 30 minutes before starting the prep). (Patient taking differently: Take 25 mg by mouth every 6 (six) hours as needed for nausea or vomiting (take 30 minutes before starting the prep). Nausea/vomiting associated with bowel prep kit)  . sennosides-docusate sodium (SENOKOT-S) 8.6-50 MG tablet Take 2 tablets by mouth at bedtime.  Manus Gunning BOWEL PREP KIT 17.5-3.13-1.6 GM/177ML SOLN Take 354 mLs by mouth as directed.  . [DISCONTINUED] lisinopril (ZESTRIL) 5 MG tablet Take 1 tablet (5 mg total) by mouth daily.      Depression screen [facility] 2/9 03/22/2020 02/04/2020  Decreased Interest 2 0  Down, Depressed, Hopeless 0 0  PHQ - 2 Score 2 0  Altered sleeping  3 -  Tired, decreased energy 2 -  Change in appetite 0 -  Feeling bad or failure about yourself  0 -  Trouble concentrating 0 -  Moving slowly or fidgety/restless 0 -  Suicidal thoughts 0 -  PHQ-9 Score 7 -     Objective:   Today's Vitals: BP 118/70   Pulse 77   Temp 98.1 F (36.7 C) (Temporal)   Ht $R'5\' 8"'Ka$  (1.727 m)   Wt 211 lb 9.6 oz (96 kg)   LMP 04/25/2014   SpO2 98%   BMI 32.17 kg/m  Vitals with BMI 05/24/2020 05/11/2020 05/11/2020  Height $Remov'5\' 8"'iDnzXq$  - -  Weight 211 lbs 10 oz - -  BMI 43.88 -  -  Systolic 875 99 797  Diastolic 70 69 83  Pulse 77 71 71     Physical Exam  She remains obese.  Blood pressure is excellent control.  She is alert and orientated without any focal neurological signs.     Assessment   1. Constipation, unspecified constipation type   2. Hypertension, unspecified type   3. Obesity (BMI 30-39.9)       Tests ordered No orders of the defined types were placed in this encounter.    Plan: 1. She will continue with lisinopril for hypertension which is well controlled and I have sent a refill today. 2. She will continue with Senokot-S for her constipation but I have also recommended that she drink at least 100 ounces of water daily and eat more fiber rich foods such as beans and green leafy vegetables. 3. I will see her in about 3 months time for an annual physical exam.  She did get first dose of COVID-19 vaccine 6 months ago and I have told her it would be perfectly okay to get a second dose now even though it is delayed.   Meds ordered this encounter  Medications  . lisinopril (ZESTRIL) 5 MG tablet    Sig: Take 1 tablet (5 mg total) by mouth daily.    Dispense:  90 tablet    Refill:  0    Haydan Wedig Luther Parody, MD

## 2020-07-07 ENCOUNTER — Encounter (INDEPENDENT_AMBULATORY_CARE_PROVIDER_SITE_OTHER): Payer: Self-pay | Admitting: *Deleted

## 2020-08-11 ENCOUNTER — Ambulatory Visit (INDEPENDENT_AMBULATORY_CARE_PROVIDER_SITE_OTHER): Payer: 59 | Admitting: Nurse Practitioner

## 2020-08-11 ENCOUNTER — Other Ambulatory Visit: Payer: Self-pay

## 2020-08-11 ENCOUNTER — Encounter (INDEPENDENT_AMBULATORY_CARE_PROVIDER_SITE_OTHER): Payer: Self-pay | Admitting: Nurse Practitioner

## 2020-08-11 VITALS — BP 124/82 | HR 79 | Temp 97.7°F | Ht 68.0 in | Wt 214.2 lb

## 2020-08-11 DIAGNOSIS — M1990 Unspecified osteoarthritis, unspecified site: Secondary | ICD-10-CM | POA: Diagnosis not present

## 2020-08-11 MED ORDER — DICLOFENAC SODIUM 1 % EX GEL: 4.0000 g | Freq: Four times a day (QID) | CUTANEOUS | 2 refills | Status: AC
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
## 2020-08-11 NOTE — Patient Instructions (Addendum)
Pain Management:  Tylenol: take 500-1000mg  of tylenol by mouth every 8 hours as needed for pain. Do not exceed 3000mg  in 24 hours.  Ibuprofen: take 400-600mg  of tylenol by mouth every 8 hours as needed for pain if you need something between tylenol doses. Do not exceed 3600mg  in 24 hours.

## 2020-08-23 ENCOUNTER — Encounter: Payer: Self-pay | Admitting: Orthopaedic Surgery

## 2020-08-23 ENCOUNTER — Other Ambulatory Visit: Payer: Self-pay

## 2020-08-23 ENCOUNTER — Ambulatory Visit (INDEPENDENT_AMBULATORY_CARE_PROVIDER_SITE_OTHER): Payer: 59 | Admitting: Orthopaedic Surgery

## 2020-08-23 VITALS — BP 156/92 | HR 81 | Ht 68.0 in | Wt 214.0 lb

## 2020-08-23 DIAGNOSIS — M545 Low back pain, unspecified: Secondary | ICD-10-CM | POA: Diagnosis not present

## 2020-08-23 DIAGNOSIS — M25552 Pain in left hip: Secondary | ICD-10-CM | POA: Diagnosis not present

## 2020-08-23 DIAGNOSIS — M25551 Pain in right hip: Secondary | ICD-10-CM

## 2020-08-23 MED ORDER — PREDNISONE 5 MG (21) PO TBPK: ORAL_TABLET | ORAL | 0 refills | Status: DC
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
## 2020-08-30 ENCOUNTER — Ambulatory Visit: Payer: 59

## 2020-08-30 ENCOUNTER — Other Ambulatory Visit: Payer: Self-pay

## 2020-08-30 ENCOUNTER — Encounter: Payer: Self-pay | Admitting: Orthopaedic Surgery

## 2020-08-30 ENCOUNTER — Ambulatory Visit (INDEPENDENT_AMBULATORY_CARE_PROVIDER_SITE_OTHER): Payer: 59 | Admitting: Orthopaedic Surgery

## 2020-08-30 VITALS — BP 123/73 | HR 75 | Ht 68.0 in | Wt 214.0 lb

## 2020-08-30 DIAGNOSIS — M25551 Pain in right hip: Secondary | ICD-10-CM | POA: Diagnosis not present

## 2020-08-30 DIAGNOSIS — M25552 Pain in left hip: Secondary | ICD-10-CM

## 2020-08-30 MED ORDER — HYDROCODONE-ACETAMINOPHEN 5-325 MG PO TABS: ORAL_TABLET | ORAL | 0 refills | Status: DC

## 2020-08-30 MED ORDER — NAPROXEN 500 MG PO TABS: 500.0000 mg | ORAL_TABLET | Freq: Two times a day (BID) | ORAL | 5 refills | Status: AC

## 2020-08-30 NOTE — Addendum Note (Signed)
Addended by: Brand Males E on: 08/30/2020 09:27 AM   Modules accepted: Orders

## 2020-08-30 NOTE — Progress Notes (Signed)
Patient UK:Mackenzie Lynch, female DOB:[birth year], 60 y.o. HCW:[id number]  Chief Complaint  Patient presents with  . Hip Pain    Bilateral    HPI  Mackenzie Lynch is a 60 y.o. female who has bilateral hip pain, more on the left.  The prednisone dose pack helped the hip pain but not the lower back or left knee pain.  She has more pain in the hips after standing about 20 minutes or walking about 15 minutes.  The pain is deep.  She has no numbness, no new trauma.   Body mass index is 32.54 kg/m.  ROS  Review of Systems  Constitutional: Positive for activity change.  Musculoskeletal: Positive for arthralgias, back pain, gait problem and joint swelling.  All other systems reviewed and are negative.   All other systems reviewed and are negative.  The following is a summary of the past history medically, past history surgically, known current medicines, social history and family history.  This information is gathered electronically by the computer from prior information and documentation.  I review this each visit and have found including this information at this point in the chart is beneficial and informative.    Past Medical History:  Diagnosis Date  . History of kidney stones   . Hyperlipidemia   . Hypertension     Past Surgical History:  Procedure Laterality Date  . BIOPSY  05/11/2020   Procedure: BIOPSY;  Surgeon: Harvel Quale, MD;  [facility];  Service: Gastroenterology;;  polyps  . CHOLECYSTECTOMY    . COLONOSCOPY WITH PROPOFOL N/A 05/11/2020   Procedure: COLONOSCOPY WITH PROPOFOL;  Surgeon: Harvel Quale, MD;  [facility];  Service: Gastroenterology;  Laterality: N/A;  730  . LITHOTRIPSY    . POLYPECTOMY  05/11/2020   Procedure: POLYPECTOMY;  Surgeon: Harvel Quale, MD;  [facility];  Service: Gastroenterology;;  . Lia Foyer TATTOO INJECTION  05/11/2020   Procedure: SUBMUCOSAL TATTOO INJECTION;  Surgeon:  Harvel Quale, MD;  [facility];  Service: Gastroenterology;;  colon  . TUBAL LIGATION      Family History  Problem Relation Age of Onset  . Heart failure Mother   . Diabetes Father   . Hypertension Father   . Diabetes Sister   . Diabetes Brother     Social History Social History   Tobacco Use  . Smoking status: Current Every Day Smoker    Packs/day: 1.00    Types: Cigarettes  . Smokeless tobacco: Never Used  Vaping Use  . Vaping Use: Never used  Substance Use Topics  . Alcohol use: No  . Drug use: No    Allergies  Allergen Reactions  . Sulfa Antibiotics Nausea Only    Current Outpatient Medications  Medication Sig Dispense Refill  . diclofenac Sodium (VOLTAREN) 1 % GEL Apply 4 g topically 4 (four) times daily. 50 g 2  . lisinopril (ZESTRIL) 5 MG tablet Take 1 tablet (5 mg total) by mouth daily. 90 tablet 0  . sennosides-docusate sodium (SENOKOT-S) 8.6-50 MG tablet Take 2 tablets by mouth at bedtime.    . predniSONE (STERAPRED UNI-PAK 21 TAB) 5 MG (21) TBPK tablet Take 6 pills first day; 5 pills second day; 4 pills third day; 3 pills fourth day; 2 pills next day and 1 pill last day. (Patient not taking: Reported on 08/30/2020) 21 tablet 0   No current facility-administered medications for this visit.     Physical Exam  Blood  pressure 123/73, pulse 75, height 5\' 8"  (1.727 m), weight 214 lb (97.1 kg), last menstrual period 04/25/2014.  Constitutional: overall normal hygiene, normal nutrition, well developed, normal grooming, normal body habitus. Assistive device:none  Musculoskeletal: gait and station Limp none, muscle tone and strength are normal, no tremors or atrophy is present.  .  Neurological: coordination overall normal.  Deep tendon reflex/nerve stretch intact.  Sensation normal.  Cranial nerves II-XII intact.   Skin:   Normal overall no scars, lesions, ulcers or rashes. No psoriasis.  Psychiatric: Alert and oriented x 3.  Recent  memory intact, remote memory unclear.  Normal mood and affect. Well groomed.  Good eye contact.  Cardiovascular: overall no swelling, no varicosities, no edema bilaterally, normal temperatures of the legs and arms, no clubbing, cyanosis and good capillary refill.  Lymphatic: palpation is normal.  Her hips have good ROM.  She is not tender over the trochanteric areas of the hips.  She has no limp today.  NV intact.  Back is with no symptoms today.  All other systems reviewed and are negative   The patient has been educated about the nature of the problem(s) and counseled on treatment options.  The patient appeared to understand what I have discussed and is in agreement with it.  Encounter Diagnosis  Name Primary?  . Bilateral hip pain Yes  x-rays were done of both hips, reported separately.   PLAN Call if any problems.  Precautions discussed.  Continue current medications.  I will begin Naprosyn and pain medicine.  Return to clinic 2 weeks   I will get MRI of the left hip.  I have reviewed the [place] [facility] web site prior to prescribing narcotic medicine for this patient.   Electronically Signed Sanjuana Kava, MD 1/25/20228:41 AM

## 2020-08-31 ENCOUNTER — Ambulatory Visit (INDEPENDENT_AMBULATORY_CARE_PROVIDER_SITE_OTHER): Payer: 59 | Admitting: Internal Medicine

## 2020-08-31 ENCOUNTER — Encounter (INDEPENDENT_AMBULATORY_CARE_PROVIDER_SITE_OTHER): Payer: Self-pay | Admitting: Internal Medicine

## 2020-08-31 VITALS — BP 110/74 | HR 75 | Temp 96.9°F | Ht 67.0 in | Wt 216.8 lb

## 2020-08-31 DIAGNOSIS — E559 Vitamin D deficiency, unspecified: Secondary | ICD-10-CM

## 2020-08-31 DIAGNOSIS — Z1159 Encounter for screening for other viral diseases: Secondary | ICD-10-CM | POA: Diagnosis not present

## 2020-08-31 DIAGNOSIS — E785 Hyperlipidemia, unspecified: Secondary | ICD-10-CM

## 2020-08-31 DIAGNOSIS — I10 Essential (primary) hypertension: Secondary | ICD-10-CM | POA: Diagnosis not present

## 2020-08-31 MED ORDER — LISINOPRIL 5 MG PO TABS: 5.0000 mg | ORAL_TABLET | Freq: Every day | ORAL | 1 refills | Status: AC

## 2020-08-31 NOTE — Progress Notes (Signed)
Metrics: Intervention Frequency ACO  Documented Smoking Status Yearly  Screened one or more times in 24 months  Cessation Counseling or  Active cessation medication Past 24 months  Past 24 months   Guideline developer: UpToDate (See UpToDate for funding source) Date Released: 2014       Wellness Office Visit  Subjective:  Patient ID: Mackenzie Lynch, female    DOB: 06-28-1961  Age: 60 y.o. MRN: [id number]  CC: This patient was scheduled for an annual physical examination today but after discussion, she would rather not do that and do a follow-up so we did a follow-up regarding her hypertension, dyslipidemia. HPI  She tells me in the past that she was on statin therapy but this produced myalgia and she was not able to tolerate it. She continues on lisinopril at a small dose and this seems to have kept her blood pressure under good control. Currently she is battling with hip pain and has seen orthopedics who is going to send her for MRI of the hip. Past Medical History:  Diagnosis Date  . History of kidney stones   . Hyperlipidemia   . Hypertension    Past Surgical History:  Procedure Laterality Date  . BIOPSY  05/11/2020   Procedure: BIOPSY;  Surgeon: Harvel Quale, MD;  [facility];  Service: Gastroenterology;;  polyps  . CHOLECYSTECTOMY    . COLONOSCOPY WITH PROPOFOL N/A 05/11/2020   Procedure: COLONOSCOPY WITH PROPOFOL;  Surgeon: Harvel Quale, MD;  [facility];  Service: Gastroenterology;  Laterality: N/A;  730  . LITHOTRIPSY    . POLYPECTOMY  05/11/2020   Procedure: POLYPECTOMY;  Surgeon: Harvel Quale, MD;  [facility];  Service: Gastroenterology;;  . Lia Foyer TATTOO INJECTION  05/11/2020   Procedure: SUBMUCOSAL TATTOO INJECTION;  Surgeon: Harvel Quale, MD;  [facility];  Service: Gastroenterology;;  colon  . TUBAL LIGATION       Family History  Problem Relation Age of Onset   . Heart failure Mother   . Diabetes Father   . Hypertension Father   . Diabetes Sister   . 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   Social History   Tobacco Use  . Smoking status: Current Every Day Smoker    Packs/day: 1.00    Types: Cigarettes  . Smokeless tobacco: Never Used  Substance Use Topics  . Alcohol use: No    Current Meds  Medication Sig  . diclofenac Sodium (VOLTAREN) 1 % GEL Apply 4 g topically 4 (four) times daily.  Marland Kitchen HYDROcodone-acetaminophen (NORCO/VICODIN) 5-325 MG tablet One tablet every four hours for pain.  . naproxen (NAPROSYN) 500 MG tablet Take 1 tablet (500 mg total) by mouth 2 (two) times daily with a meal.  . sennosides-docusate sodium (SENOKOT-S) 8.6-50 MG tablet Take 2 tablets by mouth at bedtime.  . [DISCONTINUED] lisinopril (ZESTRIL) 5 MG tablet Take 1 tablet (5 mg total) by mouth daily.      Depression screen [facility] 2/9 08/31/2020 03/22/2020 02/04/2020  Decreased Interest 0 2 0  Down, Depressed, Hopeless 0 0 0  PHQ - 2 Score 0 2 0  Altered sleeping 0 3 -  Tired, decreased energy 0 2 -  Change in appetite 0 0 -  Feeling bad or failure about yourself  0 0 -  Trouble concentrating 0 0 -  Moving slowly or fidgety/restless 0 0 -  Suicidal thoughts 0 0 -  PHQ-9 Score 0 7 -  Difficult doing work/chores Not difficult at all - -     Objective:   Today's Vitals: BP 110/74   Pulse 75   Temp (!) 96.9 F (36.1 C) (Temporal)   Ht 5\' 7"  (1.702 m)   Wt 216 lb 12.8 oz (98.3 kg)   LMP 04/25/2014   SpO2 97%   BMI 33.96 kg/m  Vitals with BMI 08/31/2020 08/30/2020 08/23/2020  Height 5\' 7"  5\' 8"  5\' 8"   Weight 216 lbs 13 oz 214 lbs 214 lbs  BMI 33.95 93.79 02.40  Systolic 973 532 992  Diastolic 74 73 92  Pulse 75 75 81     Physical Exam   She remains obese and has not lost any appreciable weight.  Blood pressure is excellent  control.  She is alert and orientated without any focal neurologic signs.    Assessment   1. Hypertension, unspecified type   2. Hyperlipidemia, unspecified hyperlipidemia type   3. Vitamin D deficiency disease   4. Encounter for hepatitis C screening test for low risk patient       Tests ordered Orders Placed This Encounter  Procedures  . COMPLETE METABOLIC PANEL WITH GFR  . Lipid panel  . Hepatitis C antibody  . VITAMIN D 25 Hydroxy (Vit-D Deficiency, Fractures)     Plan: 1. She will continue with lisinopril for hypertension which is well controlled and I have refilled this prescription today. 2. Other blood work has been ordered today including renal function, lipid panel, vitamin D levels and hepatitis C. 3. Further recommendations will depend on blood results and she will follow-up with Sarah in 3 months time.   Meds ordered this encounter  Medications  . lisinopril (ZESTRIL) 5 MG tablet    Sig: Take 1 tablet (5 mg total) by mouth daily.    Dispense:  90 tablet    Refill:  1    Wadie Liew Luther Parody, MD

## 2020-09-01 LAB — COMPLETE METABOLIC PANEL WITH GFR
AG Ratio: 1.4 (calc) (ref 1.0–2.5)
ALT: 16 U/L (ref 6–29)
AST: 16 U/L (ref 10–35)
Albumin: 4 g/dL (ref 3.6–5.1)
Alkaline phosphatase (APISO): 72 U/L (ref 37–153)
BUN: 19 mg/dL (ref 7–25)
CO2: 25 mmol/L (ref 20–32)
Calcium: 9.2 mg/dL (ref 8.6–10.4)
Chloride: 101 mmol/L (ref 98–110)
Creat: 0.99 mg/dL (ref 0.50–1.05)
GFR, Est African American: 73 mL/min/{1.73_m2} (ref >=60)
GFR, Est Non African American: 63 mL/min/{1.73_m2} (ref >=60)
Globulin: 2.8 g/dL (calc) (ref 1.9–3.7)
Glucose, Bld: 90 mg/dL (ref 65–139)
Potassium: 4.9 mmol/L (ref 3.5–5.3)
Sodium: 138 mmol/L (ref 135–146)
Total Bilirubin: 0.4 mg/dL (ref 0.2–1.2)
Total Protein: 6.8 g/dL (ref 6.1–8.1)

## 2020-09-01 LAB — LIPID PANEL
Cholesterol: 200 mg/dL — ABNORMAL HIGH (ref <200)
HDL: 57 mg/dL (ref >=50)
LDL Cholesterol (Calc): 126 mg/dL (calc) — ABNORMAL HIGH
Non-HDL Cholesterol (Calc): 143 mg/dL (calc) — ABNORMAL HIGH (ref <130)
Total CHOL/HDL Ratio: 3.5 (calc) (ref <5.0)
Triglycerides: 71 mg/dL (ref <150)

## 2020-09-01 LAB — VITAMIN D 25 HYDROXY (VIT D DEFICIENCY, FRACTURES): Vit D, 25-Hydroxy: 16 ng/mL — ABNORMAL LOW (ref 30–100)

## 2020-09-01 LAB — HEPATITIS C ANTIBODY
Hepatitis C Ab: NONREACTIVE
SIGNAL TO CUT-OFF: 0.01 (ref <1.00)

## 2020-09-05 ENCOUNTER — Telehealth (INDEPENDENT_AMBULATORY_CARE_PROVIDER_SITE_OTHER): Payer: Self-pay | Admitting: Nurse Practitioner

## 2020-09-05 NOTE — Telephone Encounter (Signed)
Please call this patient and let her know that she is due for repeat diagnostic mammogram and ultrasound of the left breast in February. Let me know if she is agreeable to having this imaging test completed. If she is agreeable to it, I will order the test so we can start to work on scheduling. Thank you.

## 2020-09-05 NOTE — Telephone Encounter (Signed)
Called patient and she stated that she is not working right now and wants to wait about a month or two months and if we could remind her then? She is having a hard time and has a lot to pay at the moment and applied for disability.

## 2020-09-05 NOTE — Telephone Encounter (Signed)
Noted, I will send myself another reminder in a month or two. Thank you.

## 2020-09-13 ENCOUNTER — Ambulatory Visit: Payer: 59 | Admitting: Orthopaedic Surgery

## 2020-09-20 ENCOUNTER — Encounter: Payer: Self-pay | Admitting: Orthopaedic Surgery

## 2020-10-04 ENCOUNTER — Ambulatory Visit (INDEPENDENT_AMBULATORY_CARE_PROVIDER_SITE_OTHER): Payer: 59 | Admitting: Orthopaedic Surgery

## 2020-10-04 ENCOUNTER — Other Ambulatory Visit: Payer: Self-pay

## 2020-10-04 ENCOUNTER — Encounter: Payer: Self-pay | Admitting: Orthopaedic Surgery

## 2020-10-04 VITALS — BP 118/82 | HR 74 | Ht 67.0 in | Wt 211.0 lb

## 2020-10-04 DIAGNOSIS — G8929 Other chronic pain: Secondary | ICD-10-CM | POA: Diagnosis not present

## 2020-10-04 DIAGNOSIS — M25552 Pain in left hip: Secondary | ICD-10-CM | POA: Diagnosis not present
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
## 2020-10-18 ENCOUNTER — Ambulatory Visit: Payer: 59 | Admitting: Orthopaedic Surgery

## 2020-10-21 ENCOUNTER — Ambulatory Visit (HOSPITAL_COMMUNITY)
Admission: RE | Admit: 2020-10-21 | Discharge: 2020-10-21 | Disposition: A | Discharge status: Home / Self Care | Payer: 59 | Source: Ambulatory Visit | Attending: Orthopaedic Surgery | Admitting: Orthopaedic Surgery

## 2020-10-21 DIAGNOSIS — M25552 Pain in left hip: Secondary | ICD-10-CM | POA: Diagnosis present

## 2020-10-21 DIAGNOSIS — G8929 Other chronic pain: Secondary | ICD-10-CM | POA: Insufficient documentation

## 2020-10-25 ENCOUNTER — Encounter: Payer: Self-pay | Admitting: Orthopaedic Surgery

## 2020-10-25 ENCOUNTER — Ambulatory Visit (INDEPENDENT_AMBULATORY_CARE_PROVIDER_SITE_OTHER): Payer: 59 | Admitting: Orthopaedic Surgery

## 2020-10-25 ENCOUNTER — Other Ambulatory Visit: Payer: Self-pay

## 2020-10-25 VITALS — BP 130/86 | HR 83 | Ht 68.0 in | Wt 223.0 lb

## 2020-10-25 DIAGNOSIS — M25552 Pain in left hip: Secondary | ICD-10-CM

## 2020-10-25 DIAGNOSIS — M25551 Pain in right hip: Secondary | ICD-10-CM

## 2020-10-25 DIAGNOSIS — M160 Bilateral primary osteoarthritis of hip: Secondary | ICD-10-CM

## 2020-10-25 DIAGNOSIS — F17210 Nicotine dependence, cigarettes, uncomplicated: Secondary | ICD-10-CM

## 2020-10-25 DIAGNOSIS — S73192A Other sprain of left hip, initial encounter: Secondary | ICD-10-CM

## 2020-10-25 NOTE — Progress Notes (Signed)
Patient IR:Mackenzie Lynch, female DOB:April 04, 1961, 60 y.o. YBO:[id number]  Chief Complaint  Patient presents with  . Hip Pain    L/ still hurts, here to go over MRI.    HPI  Mackenzie Lynch is a 60 y.o. female who has bilateral hip pain.  She has more pain on the left hip.  She had MRI done which showed:  IMPRESSION: 1. Mild bilateral hip osteoarthritis. No acute osseous abnormality. 2. Partial tear of the left anterior superior labrum. 3. Small partial tears of both anterior gluteus medius tendons at their insertions. 4. 2.3 cm simple appearing cyst in the left ovary. No follow-up imaging recommended. Note: This recommendation does not apply to premenarchal patients and to those with increased risk (genetic, family history, elevated tumor markers or other high-risk factors) of ovarian cancer. Reference: JACR 2020 Feb; 17(2):248-254  I have explained the findings to her.  As to the cyst, she is aware of it.  She is a candidate for total hip.  She has pain more at night. She is taking the Naprosyn. She smokes and I spent some time talking to her about stopping or at least cutting back on her smoking.   Body mass index is 33.91 kg/m.  ROS  Review of Systems  All other systems reviewed and are negative.  The following is a summary of the past history medically, past history surgically, known current medicines, social history and family history.  This information is gathered electronically by the computer from prior information and documentation.  I review this each visit and have found including this information at this point in the chart is beneficial and informative.    Past Medical History:  Diagnosis Date  . History of kidney stones   . Hyperlipidemia   . Hypertension     Past Surgical History:  Procedure Laterality Date  . BIOPSY  05/11/2020   Procedure: BIOPSY;  Surgeon: Harvel Quale, MD;  [facility];  Service: Gastroenterology;;  polyps  .  CHOLECYSTECTOMY    . COLONOSCOPY WITH PROPOFOL N/A 05/11/2020   Procedure: COLONOSCOPY WITH PROPOFOL;  Surgeon: Harvel Quale, MD;  [facility];  Service: Gastroenterology;  Laterality: N/A;  730  . LITHOTRIPSY    . POLYPECTOMY  05/11/2020   Procedure: POLYPECTOMY;  Surgeon: Harvel Quale, MD;  [facility];  Service: Gastroenterology;;  . Lia Foyer TATTOO INJECTION  05/11/2020   Procedure: SUBMUCOSAL TATTOO INJECTION;  Surgeon: Harvel Quale, MD;  [facility];  Service: Gastroenterology;;  colon  . TUBAL LIGATION      Family History  Problem Relation Age of Onset  . Heart failure Mother   . Diabetes Father   . Hypertension Father   . Diabetes Sister   . Diabetes Brother     Social History Social History   Tobacco Use  . Smoking status: Current Every Day Smoker    Packs/day: 1.00    Types: Cigarettes  . Smokeless tobacco: Never Used  Vaping Use  . Vaping Use: Never used  Substance Use Topics  . Alcohol use: No  . Drug use: No    Allergies  Allergen Reactions  . Sulfa Antibiotics Nausea Only    Current Outpatient Medications  Medication Sig Dispense Refill  . diclofenac Sodium (VOLTAREN) 1 % GEL Apply 4 g topically 4 (four) times daily. 50 g 2  . HYDROcodone-acetaminophen (NORCO/VICODIN) 5-325 MG tablet One tablet every four hours for pain. 30 tablet 0  .  lisinopril (ZESTRIL) 5 MG tablet Take 1 tablet (5 mg total) by mouth daily. 90 tablet 1  . naproxen (NAPROSYN) 500 MG tablet Take 1 tablet (500 mg total) by mouth 2 (two) times daily with a meal. 60 tablet 5  . sennosides-docusate sodium (SENOKOT-S) 8.6-50 MG tablet Take 2 tablets by mouth at bedtime.     No current facility-administered medications for this visit.     Physical Exam  Blood pressure 130/86, pulse 83, height 5\' 8"  (1.727 m), weight 223 lb (101.2 kg), last menstrual period 04/25/2014.  Constitutional: overall normal hygiene,  normal nutrition, well developed, normal grooming, normal body habitus. Assistive device:none  Musculoskeletal: gait and station Limp none, muscle tone and strength are normal, no tremors or atrophy is present.  .  Neurological: coordination overall normal.  Deep tendon reflex/nerve stretch intact.  Sensation normal.  Cranial nerves II-XII intact.   Skin:   Normal overall no scars, lesions, ulcers or rashes. No psoriasis.  Psychiatric: Alert and oriented x 3.  Recent memory intact, remote memory unclear.  Normal mood and affect. Well groomed.  Good eye contact.  Cardiovascular: overall no swelling, no varicosities, no edema bilaterally, normal temperatures of the legs and arms, no clubbing, cyanosis and good capillary refill.  Lymphatic: palpation is normal.  Both hips have decreased motionand pain.  NV intact.  All other systems reviewed and are negative   The patient has been educated about the nature of the problem(s) and counseled on treatment options.  The patient appeared to understand what I have discussed and is in agreement with it.  Encounter Diagnoses  Name Primary?  . Bilateral hip pain Yes  . Nicotine dependence, cigarettes, uncomplicated     PLAN Call if any problems.  Precautions discussed.  Continue current medications.   Return to clinic 2 months   Electronically Signed Sanjuana Kava, MD 3/22/20228:25 AM

## 2020-10-25 NOTE — Patient Instructions (Signed)

## 2020-11-01 ENCOUNTER — Encounter (INDEPENDENT_AMBULATORY_CARE_PROVIDER_SITE_OTHER): Payer: Self-pay | Admitting: *Deleted

## 2020-11-02 NOTE — Telephone Encounter (Signed)
Okay, I will send a reminder to myself in a few months to reach back out to her.

## 2020-11-02 NOTE — Telephone Encounter (Signed)
Please call this patient and ask her if now would be a better time to pursue the ultrasound of her left breast. This was recommended by radiologist back in August of 2021 to complete repeat imaging in February 2022 to further investigate previous imaging study completed in August.

## 2020-11-02 NOTE — Telephone Encounter (Signed)
Called patient and gave her the message and she stated that she is still not working right now and has several bills to pay and now is not a good time for her and to please check back with her at a later time.

## 2020-11-30 ENCOUNTER — Encounter (INDEPENDENT_AMBULATORY_CARE_PROVIDER_SITE_OTHER): Payer: Self-pay | Admitting: Nurse Practitioner

## 2020-11-30 ENCOUNTER — Other Ambulatory Visit: Payer: Self-pay

## 2020-11-30 ENCOUNTER — Ambulatory Visit (INDEPENDENT_AMBULATORY_CARE_PROVIDER_SITE_OTHER): Payer: 59 | Admitting: Nurse Practitioner

## 2020-11-30 VITALS — BP 118/78 | HR 59 | Temp 97.5°F | Ht 67.0 in | Wt 222.6 lb

## 2020-11-30 DIAGNOSIS — I10 Essential (primary) hypertension: Secondary | ICD-10-CM

## 2020-11-30 DIAGNOSIS — E785 Hyperlipidemia, unspecified: Secondary | ICD-10-CM

## 2020-11-30 DIAGNOSIS — L989 Disorder of the skin and subcutaneous tissue, unspecified: Secondary | ICD-10-CM | POA: Diagnosis not present

## 2020-11-30 DIAGNOSIS — E559 Vitamin D deficiency, unspecified: Secondary | ICD-10-CM

## 2020-11-30 DIAGNOSIS — M1990 Unspecified osteoarthritis, unspecified site: Secondary | ICD-10-CM

## 2020-11-30 DIAGNOSIS — Z720 Tobacco use: Secondary | ICD-10-CM

## 2020-11-30 NOTE — Patient Instructions (Signed)
Consider getting your son to speak to his HR department at work to discuss FMLA (family medical leave act) paperwork/approval so that he can come to [place] to help you recover from surgery without losing his job.

## 2020-11-30 NOTE — Progress Notes (Signed)
Subjective:  Patient ID: Mackenzie Lynch, female    DOB: [birth year]  Age: 60 y.o. MRN: [id number]  CC:  Chief Complaint  Patient presents with  . Follow-up    May need a hip replacement, thinking of moving to [place], check places on left cheek because they are discolored, quick smoking and using the patch  . Osteoarthritis  . Hypertension  . Hyperlipidemia  . Nicotine Dependence  . Other    Skin lesion      HPI  This patient arrives today for the above.  Osteoarthritis: She has osteoarthritis of bilateral hips.  She has been evaluated by orthopedics.  They have told her she probably does require hip replacement and she is considering getting this surgery for treatment of her pain.  She also continues on Voltaren gel, Vicodin, and naproxen as needed.  She is thinking about relocating to [place] as she has family there and will have more assistance with recovery if she has to have surgery.  Hypertension: She continues on lisinopril and is tolerating his medication well.  Hyperlipidemia: Last LDL was 126.  ASCVD risk was approximately 7.4.  Nicotine dependence: She quit smoking approximately 27 days ago!  She has been using the patch and has been successful thus far.  Skin lesion: She has 2 skin lesions to the left side of her face.  1 is directly underneath her lower eyelid, and the other 1 is to her left cheek.  The area on her cheek is been present for about 6 months.  She tells me she does have a family history of skin cancer and wanted these areas to be evaluated.  Vitamin D deficiency: She also has history of vitamin D deficiency.  Last serum check showed a vitamin D level of 16.  She has been taking 5000 IUs of vitamin D3 daily since results came back.  Past Medical History:  Diagnosis Date  . History of kidney stones   . Hyperlipidemia   . Hypertension       Family History  Problem Relation Age of Onset  . Heart failure Mother   . Diabetes Father   .  Hypertension Father   . Diabetes Sister   . 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   Social History   Tobacco Use  . Smoking status: Former Smoker    Types: Cigarettes    Quit date: 11/04/2020    Years since quitting: 0.0  . Smokeless tobacco: Never Used  . Tobacco comment: Using a patch  Substance Use Topics  . Alcohol use: No     Current Meds  Medication Sig  . Cholecalciferol 125 MCG (5000 UT) TABS Take 5,000 Int'l Units by mouth.  . diclofenac Sodium (VOLTAREN) 1 % GEL Apply 4 g topically 4 (four) times daily.  Marland Kitchen HYDROcodone-acetaminophen (NORCO/VICODIN) 5-325 MG tablet One tablet every four hours for pain.  Marland Kitchen lisinopril (ZESTRIL) 5 MG tablet Take 1 tablet (5 mg total) by mouth daily.  . naproxen (NAPROSYN) 500 MG tablet Take 1 tablet (500 mg total) by mouth 2 (two) times daily with a meal.  . sennosides-docusate sodium (SENOKOT-S) 8.6-50 MG tablet Take 2 tablets by mouth at bedtime.    ROS:  See HPI   Objective:   Today's Vitals: BP 118/78   Pulse (!) 59   Temp (!) 97.5 F (36.4 C) (Temporal)   Ht 5' 7" (1.702  m)   Wt 222 lb 9.6 oz (101 kg)   LMP 04/25/2014   SpO2 99%   BMI 34.86 kg/m  Vitals with BMI 11/30/2020 10/25/2020 10/04/2020  Height 5' 7" 5' 8" 5' 7"  Weight 222 lbs 10 oz 223 lbs 211 lbs  BMI 34.86 00.17 49.44  Systolic 967 591 638  Diastolic 78 86 82  Pulse 59 83 74     Physical Exam Vitals reviewed.  Constitutional:      General: She is not in acute distress.    Appearance: Normal appearance.  HENT:     Head: Normocephalic and atraumatic.  Eyes:   Neck:     Vascular: No carotid bruit.  Cardiovascular:     Rate and Rhythm: Normal rate and regular rhythm.     Pulses: Normal pulses.     Heart sounds: Normal heart sounds.  Pulmonary:     Effort: Pulmonary effort is normal.     Breath sounds: Normal breath sounds.   Skin:    General: Skin is warm and dry.       Neurological:     General: No focal deficit present.     Mental Status: She is alert and oriented to person, place, and time.  Psychiatric:        Mood and Affect: Mood normal.        Behavior: Behavior normal.        Judgment: Judgment normal.          Assessment and Plan   1. Vitamin D deficiency disease   2. Skin lesion   3. Hypertension, unspecified type   4. Hyperlipidemia, unspecified hyperlipidemia type   5. Tobacco abuse   6. Osteoarthritis, unspecified osteoarthritis type, unspecified site      Plan: 1.  We will check serum vitamin D level for further evaluation today. 2.  I believe the lesion under her eyelid represents xanthelasma, breast the lesion on her cheek may be warty growth versus basal cell carcinoma.  I will refer her to dermatology for further evaluation and assistance with managing. 3.  Blood pressure at goal on current medication regimen should continue taking her lisinopril. 4.,  5.  She quit smoking which is wonderful.  I congratulated her on this.  I encouraged her to continue to abstain from cigarette smoking.  This should help reduce her risk of heart attack or stroke. 6.  She will follow-up with orthopedics as scheduled, and continue taking her medications as currently prescribed.   Tests ordered Orders Placed This Encounter  Procedures  . CMP with eGFR([facility])  . Vitamin D, 25-hydroxy  . Ambulatory referral to Dermatology      No orders of the defined types were placed in this encounter.   Patient to follow-up in 3 months or sooner as needed.  Ailene Ards, NP

## 2020-12-01 LAB — COMPLETE METABOLIC PANEL WITH GFR
AG Ratio: 1.5 (calc) (ref 1.0–2.5)
ALT: 20 U/L (ref 6–29)
AST: 21 U/L (ref 10–35)
Albumin: 4.3 g/dL (ref 3.6–5.1)
Alkaline phosphatase (APISO): 72 U/L (ref 37–153)
BUN: 15 mg/dL (ref 7–25)
CO2: 26 mmol/L (ref 20–32)
Calcium: 9.6 mg/dL (ref 8.6–10.4)
Chloride: 102 mmol/L (ref 98–110)
Creat: 0.89 mg/dL (ref 0.50–1.05)
GFR, Est African American: 83 mL/min/{1.73_m2} (ref >=60)
GFR, Est Non African American: 72 mL/min/{1.73_m2} (ref >=60)
Globulin: 2.9 g/dL (calc) (ref 1.9–3.7)
Glucose, Bld: 92 mg/dL (ref 65–139)
Potassium: 4.6 mmol/L (ref 3.5–5.3)
Sodium: 138 mmol/L (ref 135–146)
Total Bilirubin: 0.5 mg/dL (ref 0.2–1.2)
Total Protein: 7.2 g/dL (ref 6.1–8.1)

## 2020-12-01 LAB — VITAMIN D 25 HYDROXY (VIT D DEFICIENCY, FRACTURES): Vit D, 25-Hydroxy: 27 ng/mL — ABNORMAL LOW (ref 30–100)

## 2020-12-22 ENCOUNTER — Ambulatory Visit (INDEPENDENT_AMBULATORY_CARE_PROVIDER_SITE_OTHER): Payer: 59 | Admitting: Orthopaedic Surgery

## 2020-12-22 ENCOUNTER — Other Ambulatory Visit: Payer: Self-pay

## 2020-12-22 ENCOUNTER — Encounter: Payer: Self-pay | Admitting: Orthopaedic Surgery

## 2020-12-22 VITALS — BP 99/75 | HR 77 | Ht 68.0 in | Wt 219.2 lb

## 2020-12-22 DIAGNOSIS — F17210 Nicotine dependence, cigarettes, uncomplicated: Secondary | ICD-10-CM | POA: Diagnosis not present

## 2020-12-22 DIAGNOSIS — M25551 Pain in right hip: Secondary | ICD-10-CM

## 2020-12-22 DIAGNOSIS — M25552 Pain in left hip: Secondary | ICD-10-CM | POA: Diagnosis not present

## 2020-12-22 MED ORDER — HYDROCODONE-ACETAMINOPHEN 5-325 MG PO TABS: ORAL_TABLET | ORAL | 0 refills | Status: AC

## 2020-12-22 NOTE — Progress Notes (Signed)
Patient Mackenzie Lynch, female DOB:1960/11/04, 60 y.o. KPT:[id number]  Chief Complaint  Patient presents with  . Hip Pain    Bilateral/ they are about the same. They hurt a lot, the left one has a achy pain and the right at times has a sharp pain    HPI  Mackenzie Lynch is a 60 y.o. female who has bilateral hip pain. She has good and bad days. She is active.  She has no new trauma  She is in the process of moving to [place] to be with her son.   Body mass index is 33.34 kg/m.  ROS  Review of Systems  Constitutional: Positive for activity change.  Musculoskeletal: Positive for arthralgias, back pain, gait problem and joint swelling.  All other systems reviewed and are negative.   All other systems reviewed and are negative.  The following is a summary of the past history medically, past history surgically, known current medicines, social history and family history.  This information is gathered electronically by the computer from prior information and documentation.  I review this each visit and have found including this information at this point in the chart is beneficial and informative.    Past Medical History:  Diagnosis Date  . History of kidney stones   . Hyperlipidemia   . Hypertension     Past Surgical History:  Procedure Laterality Date  . BIOPSY  05/11/2020   Procedure: BIOPSY;  Surgeon: Harvel Quale, MD;  Location: [facility];  Service: Gastroenterology;;  polyps  . CHOLECYSTECTOMY    . COLONOSCOPY WITH PROPOFOL N/A 05/11/2020   Procedure: COLONOSCOPY WITH PROPOFOL;  Surgeon: Harvel Quale, MD;  Location: [facility];  Service: Gastroenterology;  Laterality: N/A;  730  . LITHOTRIPSY    . POLYPECTOMY  05/11/2020   Procedure: POLYPECTOMY;  Surgeon: Harvel Quale, MD;  Location: [facility];  Service: Gastroenterology;;  . Lia Foyer TATTOO INJECTION  05/11/2020   Procedure: SUBMUCOSAL TATTOO INJECTION;  Surgeon:  Harvel Quale, MD;  Location: [facility];  Service: Gastroenterology;;  colon  . TUBAL LIGATION      Family History  Problem Relation Age of Onset  . Heart failure Mother   . Diabetes Father   . Hypertension Father   . Diabetes Sister   . Diabetes Brother     Social History Social History   Tobacco Use  . Smoking status: Former Smoker    Types: Cigarettes    Quit date: 11/04/2020    Years since quitting: 0.1  . Smokeless tobacco: Never Used  . Tobacco comment: Using a patch  Vaping Use  . Vaping Use: Never used  Substance Use Topics  . Alcohol use: No  . Drug use: No    Allergies  Allergen Reactions  . Sulfa Antibiotics Nausea Only    Current Outpatient Medications  Medication Sig Dispense Refill  . Cholecalciferol 125 MCG (5000 UT) TABS Take 5,000 Int'l Units by mouth.    . diclofenac Sodium (VOLTAREN) 1 % GEL Apply 4 g topically 4 (four) times daily. 50 g 2  . HYDROcodone-acetaminophen (NORCO/VICODIN) 5-325 MG tablet One tablet every four hours as needed for acute pain.  Limit of five days per [place] statue. 30 tablet 0  . lisinopril (ZESTRIL) 5 MG tablet Take 1 tablet (5 mg total) by mouth daily. 90 tablet 1  . naproxen (NAPROSYN) 500 MG tablet Take 1 tablet (500 mg total) by mouth 2 (two) times daily with a  meal. 60 tablet 5  . sennosides-docusate sodium (SENOKOT-S) 8.6-50 MG tablet Take 2 tablets by mouth at bedtime.     No current facility-administered medications for this visit.     Physical Exam  Blood pressure 99/75, pulse 77, height 5\' 8"  (1.727 m), weight 219 lb 4 oz (99.5 kg), last menstrual period 04/25/2014.  Constitutional: overall normal hygiene, normal nutrition, well developed, normal grooming, normal body habitus. Assistive device:none  Musculoskeletal: gait and station Limp none, muscle tone and strength are normal, no tremors or atrophy is present.  .  Neurological: coordination overall normal.  Deep tendon reflex/nerve  stretch intact.  Sensation normal.  Cranial nerves II-XII intact.   Skin:   Normal overall no scars, lesions, ulcers or rashes. No psoriasis.  Psychiatric: Alert and oriented x 3.  Recent memory intact, remote memory unclear.  Normal mood and affect. Well groomed.  Good eye contact.  Cardiovascular: overall no swelling, no varicosities, no edema bilaterally, normal temperatures of the legs and arms, no clubbing, cyanosis and good capillary refill.  Lymphatic: palpation is normal.  Hips have good ROM. Slightly tender over right hip trochanteric area laterally.  All other systems reviewed and are negative   The patient has been educated about the nature of the problem(s) and counseled on treatment options.  The patient appeared to understand what I have discussed and is in agreement with it.  Encounter Diagnoses  Name Primary?  . Bilateral hip pain Yes  . Nicotine dependence, cigarettes, uncomplicated     PLAN Call if any problems.  Precautions discussed.  Continue current medications.   Return to clinic prn, moving to [place]   I have reviewed the [place] web site prior to prescribing narcotic medicine for this patient.  Electronically Signed Sanjuana Kava, MD 5/19/20228:07 AM

## 2020-12-27 ENCOUNTER — Ambulatory Visit: Payer: 59 | Admitting: Orthopaedic Surgery

## 2021-03-21 ENCOUNTER — Ambulatory Visit (INDEPENDENT_AMBULATORY_CARE_PROVIDER_SITE_OTHER): Payer: 59 | Admitting: Internal Medicine

## 2021-11-04 IMAGING — MG DIGITAL SCREENING BILAT W/ TOMO W/ CAD
8 series · 8 of 24 positions shown · non-contrast
Comparison: None.

CLINICAL DATA: Screening.

EXAM:
DIGITAL SCREENING BILATERAL MAMMOGRAM WITH TOMO AND CAD

[R CC synth-2D]
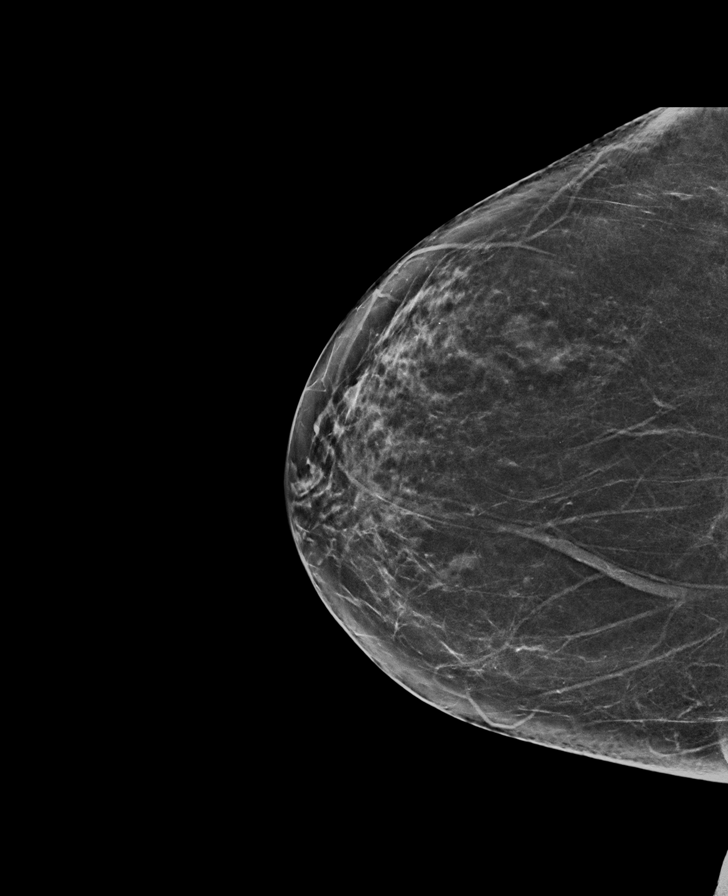

[L MLO synth-2D]
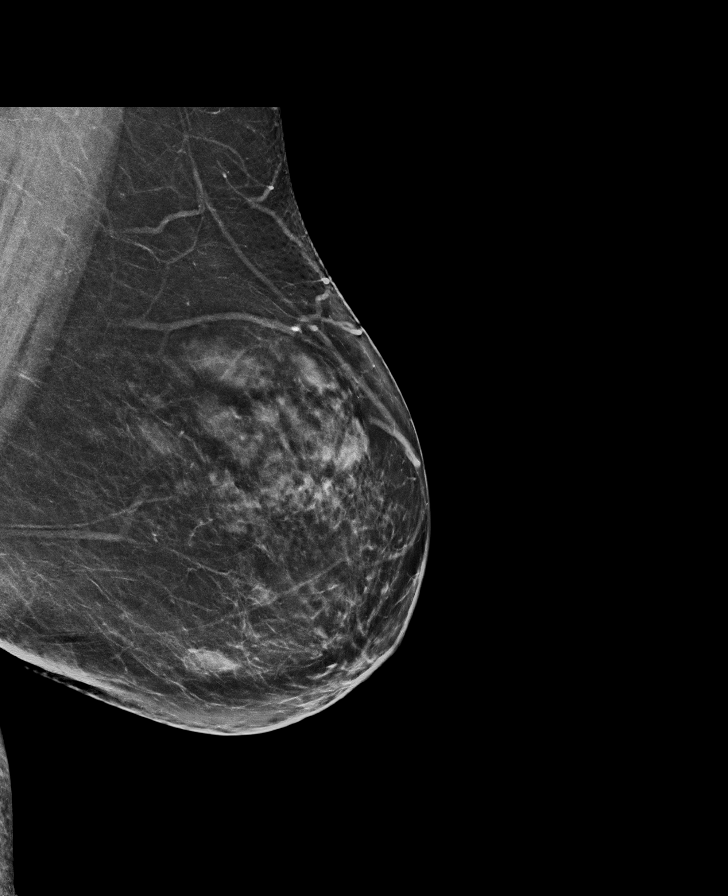

[R MLO synth-2D]
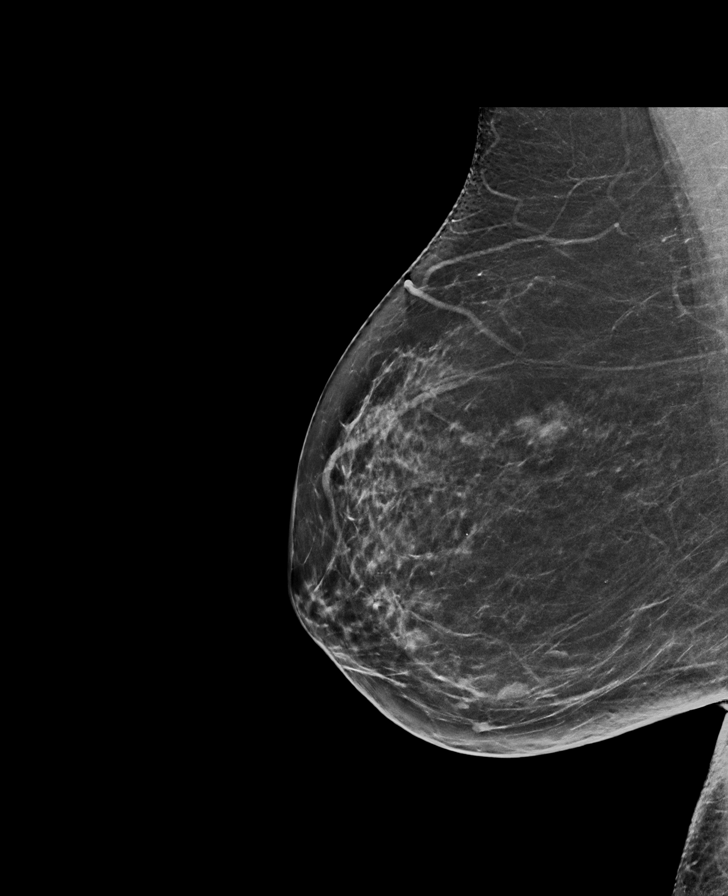

[L CC synth-2D]
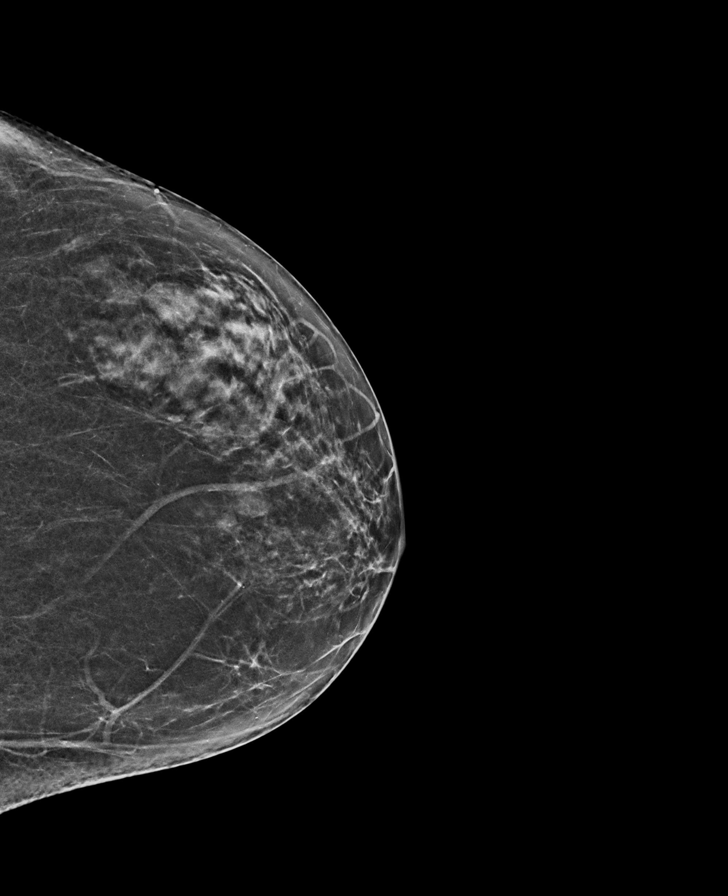

[L CC tomo · tomo slice 29/57.0]
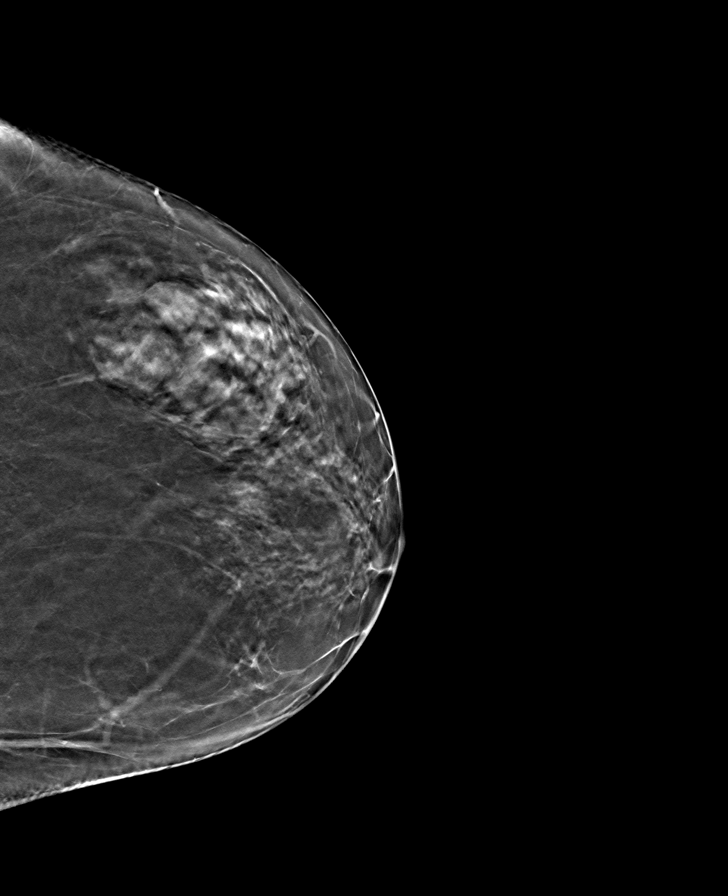

[L MLO tomo · tomo slice 36/71.0]
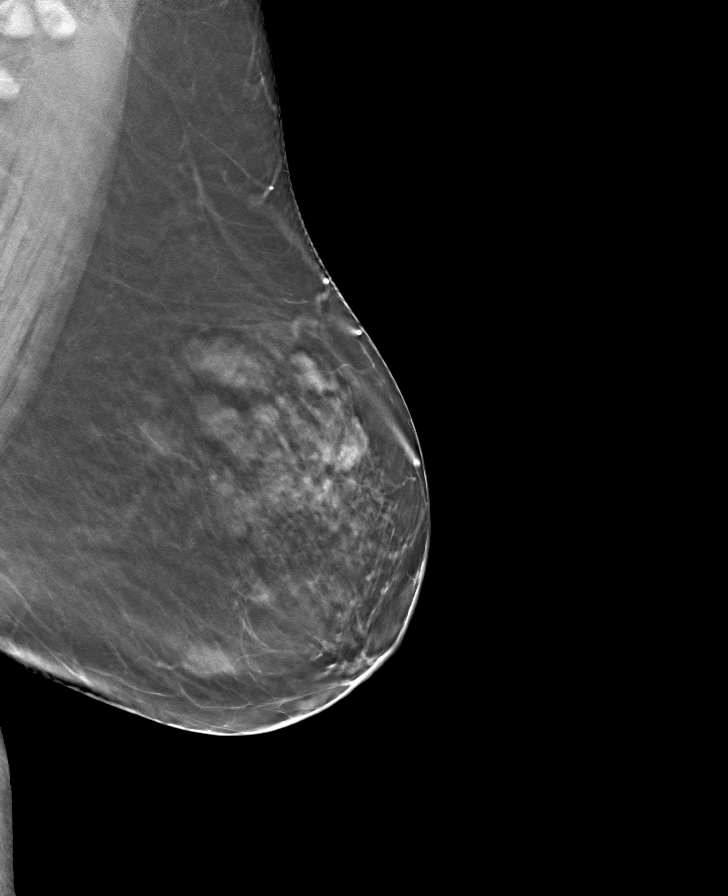

[R MLO tomo · tomo slice 35/69.0]
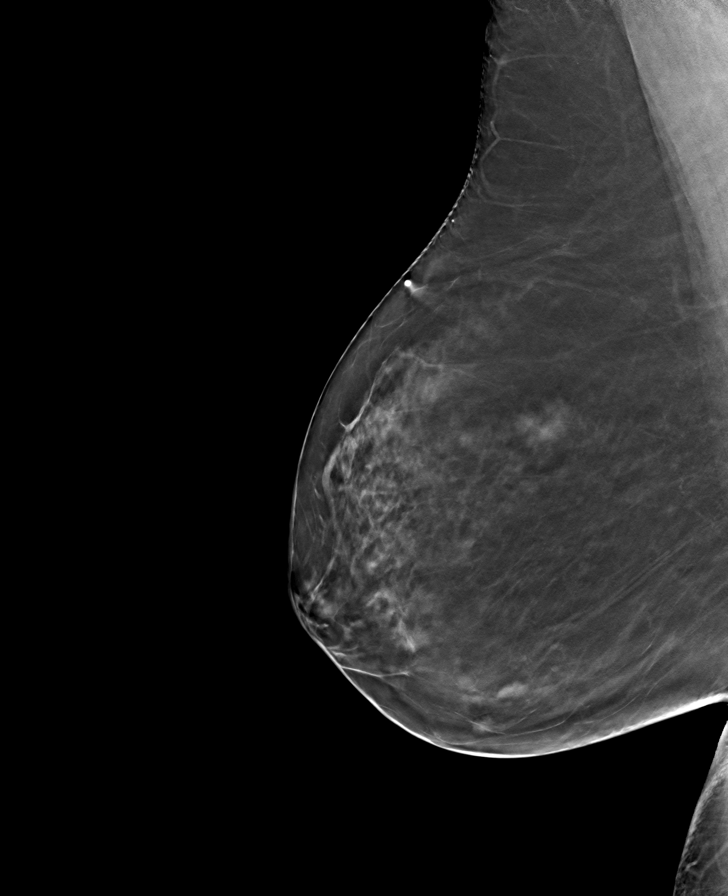

[R CC tomo · tomo slice 34/67.0]
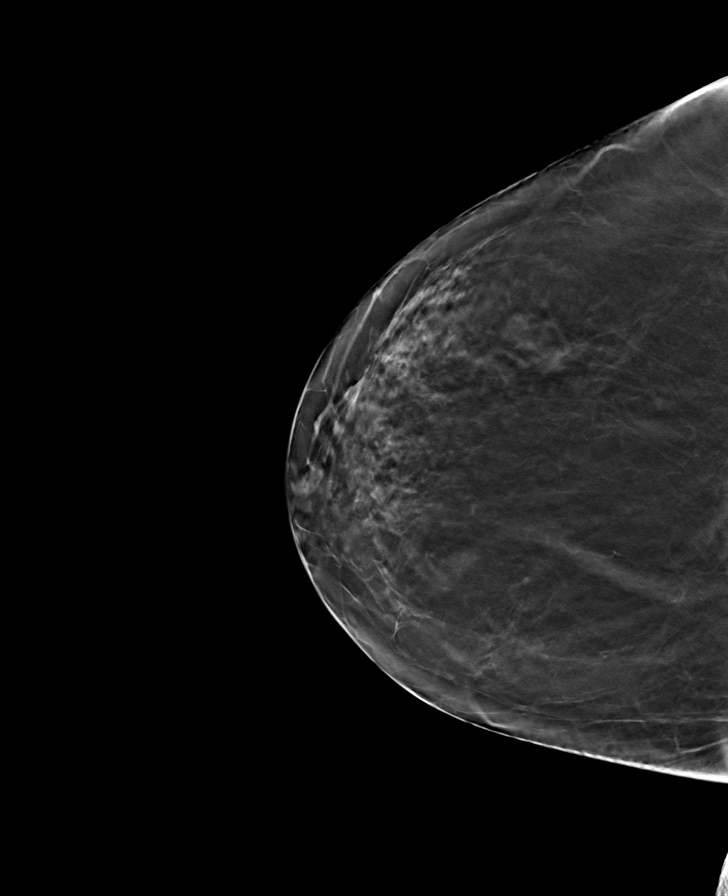

[8 of 24 positions shown; findings below may reference images not displayed]

ACR Breast Density Category c: The breast tissue is heterogeneously
dense, which may obscure small masses.
FINDINGS: In the right breast possible masses require further evaluation.

In the left breast possible masses require further evaluation.

Images were processed with CAD.
IMPRESSION: Further evaluation is suggested for possible masses in the right
breast.

Further evaluation is suggested for possible masses in the left
breast.

RECOMMENDATION:
Diagnostic mammogram and possibly ultrasound of both breasts.
(Code:[id number]-L-HHP)

The patient will be contacted regarding the findings, and additional
imaging will be scheduled.

BI-RADS CATEGORY  0: Incomplete. Need additional imaging evaluation
and/or prior mammograms for comparison.

## 2022-10-04 ENCOUNTER — Encounter: Payer: Self-pay | Admitting: Radiology
